# Patient Record
Sex: Female | Born: 1960 | Race: Asian | Smoking: Never smoker
Health system: Southern US, Community
[De-identification: ages and names within clinical notes are randomized; demographics above are authoritative.]

## PROBLEM LIST (undated history)

## (undated) DIAGNOSIS — D649 Anemia, unspecified: Secondary | ICD-10-CM

## (undated) DIAGNOSIS — F329 Major depressive disorder, single episode, unspecified: Secondary | ICD-10-CM

## (undated) DIAGNOSIS — B1920 Unspecified viral hepatitis C without hepatic coma: Secondary | ICD-10-CM

## (undated) DIAGNOSIS — K769 Liver disease, unspecified: Secondary | ICD-10-CM

## (undated) DIAGNOSIS — F32A Depression, unspecified: Secondary | ICD-10-CM

## (undated) DIAGNOSIS — K219 Gastro-esophageal reflux disease without esophagitis: Secondary | ICD-10-CM

## (undated) DIAGNOSIS — F419 Anxiety disorder, unspecified: Secondary | ICD-10-CM

## (undated) HISTORY — DX: Depression, unspecified: F32.A

## (undated) HISTORY — DX: Unspecified viral hepatitis C without hepatic coma: B19.20

## (undated) HISTORY — DX: Liver disease, unspecified: K76.9

## (undated) HISTORY — DX: Major depressive disorder, single episode, unspecified: F32.9

## (undated) HISTORY — DX: Anxiety disorder, unspecified: F41.9

## (undated) HISTORY — PX: OTHER SURGICAL HISTORY: SHX169

## (undated) HISTORY — DX: Gastro-esophageal reflux disease without esophagitis: K21.9

## (undated) HISTORY — DX: Anemia, unspecified: D64.9

---

## 2011-04-21 ENCOUNTER — Ambulatory Visit (INDEPENDENT_AMBULATORY_CARE_PROVIDER_SITE_OTHER): Payer: Self-pay | Admitting: Gastroenterology

## 2011-04-21 VITALS — BP 127/74 | HR 90 | Temp 98.3°F | Ht 61.0 in | Wt 169.0 lb

## 2011-04-21 DIAGNOSIS — B182 Chronic viral hepatitis C: Secondary | ICD-10-CM

## 2011-04-28 NOTE — Progress Notes (Signed)
NAME:  [name], [name]    MR#:  [id number]      DATE:  04/21/2011  DOB:  [birth year]    cc: Referring Physician:  Bethanie Dicker, MD, [facility], [address] [place], [place] [address], QMV [phone]    REASON FOR REFERRAL:  Positive HCV RNA, genotype 3a.    History:  The patient is a 50 year old woman who I have been asked to see in consultation by Dr. Eulis Foster regarding her positive HCV RNA.  According to the niece, who accompanied her today and provided translation, the patient was known to be hepatitis C positive while living in [place] 6-7 years ago. She is unaware of her genotype and  this was not measured as part of lab testing that was sent. She has never been biopsied nor has she been treated. She has occasional nausea with fatigue and pruritus, but has no symptoms directly  referable to her history of hepatitis C, as fatigue and nausea not related and on her liver tests, she has no jaundice as recently as 03/16/2011. There are no symptoms to suggest cryoglobulin mediated or  decompensated liver disease.  With respect to risk factors for liver disease, she denies any significant alcohol use over the course of lifetime. There is no history of intravenous or intranasal drug use. There may be unsterile  body piercing. There are no tattoos. She received a blood transfusion in [place] 13 years ago.   FAMILY HISTORY:  Significant for both parents and 2 brothers who had hepatitis C. She has not been vaccinated against hepatitis A or B.   PAST MEDICAL HISTORY:  Significant for insulin resistance. Her a.m. fasting blood sugar that she measures approximately once a week is usually 120. She is not on any medications for this. In addition, I see hemoglobin A1c of 6.3% on  03/16/2011. There is a history of normocytic but hypochromic anemia with a CBC on 03/16/2011, of 9.3. MCV was 83 and MCH was 24.3.  Old labs she brought in from 03/11/2009, in [place], her  hemoglobin was 10.91 with an MCV of 78.4 and MCH of 124.88. She has never been investigated for the cause of anemia that she is aware of. The only interventions she has had is being told to take iron, which she is on once daily by mouth. Otherwise, she has elevated triglycerides in her lab testing but she is unaware of this. There is no history of coronary disease.   PAST SURGICAL HISTORY:  Denies.   Past psychiatric history:  Denies.   MEDICATIONS:  Nexium dose unknown daily, iron pills 1 once daily, calcium once daily dose unknown.   Allergies:  None.    Habits:  Smoking, denies. Alcohol as above.   FAMILY HISTORY:  As above.   SOCIAL HISTORY:  Widowed, has 4 children. She lives in [place]. She is accompanied by her niece today who provides translation. She emigrated from [place] approximately 3 years ago.   REVIEW OF SYSTEMS:  All 10 systems reviewed today on the review of systems form, which was signed and placed in the chart. CES-D was 27.   PHYSICAL EXAMINATION:   Constitutional:  Well appearing. Vital signs: Height 61 inches, weight 169 pounds, blood pressure 127/74, pulse 90, temperature 98.3 Fahrenheit. Ears, nose, mouth and throat:  Unremarkable oropharynx.   No thyromegaly or neck masses.  Chest:  Resonant to percussion.  Clear to auscultation.  Cardiovascular:  Heart sounds normal S1, S2 without murmurs  or rubs.  There is no peripheral edema.  Abdominal:  Normal  bowel sounds.  No masses or tenderness.  I could not appreciate a liver edge or spleen tip.  I could not appreciate any hernias.  Lymphatics:  No cervical or inguinal lymphadenopathy.  Central Nervous  System:  No asterixis or focal neurologic findings.  Dermatologic:  Anicteric without palmar erythema or spider angiomata.  Eyes:  Anicteric sclerae.  Pupils are equal and reactive to light.   LABORATORY STUDIES:  From 03/16/2011; AST 30, ALT 29, albumin 3.9, globulins 2.9. Triglycerides 168. Hemoglobin A1c was  6.3%. CBC, white count 8.2, hemoglobin 9.3, MCV 83, MCH 24.3, MCHC 29.3, platelet count 536. Her  ANC was 4.9. Her ALP was 74, total bilirubin 0.2, creatinine 0.72.  On 04/16/2010, her hepatitis C antibody was positive. Her hepatitis B surface antigen negative.   On 05/24/2010, her HCV RNA was 22,000 international units per mL, genotype was 3a and her hemoglobin A1c was 7.2%.   On 04/16/2010, her hemoglobin was 9.4 with MCV of 74.3.   On 07/28/2010, it was 9.2 with MCV 77, MCH of 21.8, both of which were low.   On 09/23/2010, the iron saturation could not be performed as there was an insufficient sample. B12 and folic acid levels were normal, ferritin was 23.  From [place] a CT scan report with contrast on 10/31/2010, showed the liver was hypoattenuated consistent with fat, but there is no mention of changes of cirrhosis or portal hypertension.   ASSESSMENT:  The patient is a 50 year old woman with history genotype 3a hepatitis C who appears to be clinically and biochemically well compensated. I have 2 concerns about treating her for her hepatitis C. First is that  of her normocytic but hypochromic anemia for which she has been on iron with some possible response over time. More recently, her hemoglobin does not look like it is responding and it is too low to be considered for treatment of her hepatitis C. She may need endoscopy to further evaluate the cause of her anemia, if it indeed remains microcytic and hypochromic with iron deficiency, The second issue is that of her insulin resistance. There is a potential that this will then develop into diabetes while on treatment and the affect of interferon. As an aside, I noticed she has hypertriglyceridemia which could be exacerbated with interferon, as well.  In my discussion today with the patient and her niece, we discussed the nature and natural history of hepatitis C including genotyping. We discussed the treatment with pegylated interferon  and ribavirin. I  discussed our treatment protocol for our clinic, as well as response rates. I reviewed the specific systems, psychiatric, and constitutional side effects of therapy, as well. My discussion then  turned on the issues of her anemia and her insulin resistance, and how these would impact on her candidacy for treatment. We discussed the possibility of needing to do endoscopies at [facility] if she has iron  deficiency and also possibly starting her on metformin for her blood sugars prior to commencing on therapy for hepatitis C.   plan:  1. Standard labs today. 2. Test for hepatitis A and B immunity. 3. Given literature on hepatitis C. 4. Based on her CBC, we will see about the need for endoscopy. 5. She is to return in approximately 6-8 week's time to review the results of testing and to discuss the possibility of treatment if labs are acceptable.  Brooke Dare, MD    ADDENDUM:  There is no record of the labs that I ordered being done.  I called [facility] and they faxed the only labs they had, which were from 04/17/11  his showed a HgB of 9.4 with microcytosis and hypochromasia.  Her alb was 3.9 and total bil was 0.3, with a creatinine of 0.69. Hep B s Ag neg.Marland Kitchen  TSH 3.798.  She cannot be treated without labs, and her microcytic anemia needs to be addressed at the [facility] [place] before she can be treated.  I do not have endoscopy privileges in [place].   403 .[id number]  D:  Thu Aug 16 14:50:29 2012 ; T:  Thu Aug 16 16:42:14 2012  Job #:  [id number]

## 2011-05-11 ENCOUNTER — Other Ambulatory Visit: Payer: Self-pay | Admitting: Gastroenterology

## 2011-06-16 ENCOUNTER — Other Ambulatory Visit: Payer: Self-pay | Admitting: Gastroenterology

## 2011-06-16 ENCOUNTER — Ambulatory Visit (INDEPENDENT_AMBULATORY_CARE_PROVIDER_SITE_OTHER): Payer: Self-pay | Admitting: Gastroenterology

## 2011-06-16 DIAGNOSIS — B182 Chronic viral hepatitis C: Secondary | ICD-10-CM

## 2011-06-17 LAB — CBC WITH DIFFERENTIAL/PLATELET
Eosinophils Absolute: 0.2 10*3/uL (ref 0.0–0.7)
Hemoglobin: 8.7 g/dL — ABNORMAL LOW (ref 12.0–15.0)
Lymphocytes Relative: 32 % (ref 12–46)
Lymphs Abs: 3.2 10*3/uL (ref 0.7–4.0)
MCH: 20 pg — ABNORMAL LOW (ref 26.0–34.0)
MCV: 70.2 fL — ABNORMAL LOW (ref 78.0–100.0)
Monocytes Relative: 7 % (ref 3–12)
Neutrophils Relative %: 59 % (ref 43–77)
RBC: 4.36 MIL/uL (ref 3.87–5.11)
WBC: 10.1 10*3/uL (ref 4.0–10.5)

## 2011-06-24 ENCOUNTER — Encounter: Payer: Self-pay | Admitting: Gastroenterology

## 2011-06-24 NOTE — Progress Notes (Signed)
[name], [name]    MR#:  [id number]      DATE:  06/16/2011  DOB:  1961-07-01    cc: Referring Physician:  Bethanie Dicker, MD, [facility], [address] [place], [place] [address], [place] [id number]    REASON FOR VISIT:  Followup genotype 3A hepatitis C.   History:  The patient returns today accompanied by her daughter who provided translation. Since last being seen on 04/21/2011, there are no symptoms referable to history of hepatitis C. It will be recalled that  when I saw her she had a normocytic hypochromic anemia for which she was given iron. It may have been on the basis of menorrhagia. At the time I saw her, for some reason labs that I ordered were not  processed, but she returned on 05/11/2011, for labs which showed a hemoglobin of 9.1, MCV 76.1 and MCH of 20.7 indicating a microcytic hypochromic anemia. Iron saturation was not calculated, considering  her iron was low, because her TIBC was too high. Her ferritin was 6.4 at the time.  Folic acid and B12 levels were normal.  The patient's daughter reports that the menorrhagia started to improve the end of July, and stopped by August. She remained on iron supplement until 2 weeks ago when she ran out. When asked when they  called [facility] of [address], according the daughter, they were told not to resume this until she was seen and lab work was done.   Past medical history:  There has been no other interval change.   CURRENT MEDICATION:  Nexium dose unknown daily.  As mentioned above, she ran out of the iron and calcium she was taking 2 weeks ago, and has not resumed them pending an appointment in [facility].   ALLERGIES:  None.   HABITS:  Smoking, denies. Alcohol, denies interval consumption.   REVIEW OF SYSTEMS:  All 10 systems reviewed today with the patient via her daughter as Nurse, learning disability and they are negative other than which is mentioned above. CES-D was 27.   PHYSICAL EXAMINATION:     Constitutional:  Appeared stated age without significant peripheral wasting. Vital signs: Height 61 inches, weight 171 pounds, blood pressure 133/81, pulse 94, temperature 98 Fahrenheit.   LABORATORY STUDIES:  From 05/11/2011, CBC and iron saturation as mentioned above with a ferritin of 4. Total bilirubin 0.2, AST was 27, ALT 24, ALP 76. INR was 1.02.  Hemoglobin A1c was 6.8%. Her hepatitis B surface antigen  was negative. The hepatitis B surface antibody and total A antibody were positive. Her genotype was 3a. Her ANA was negative, and her HIV was negative.   ASSESSMENT:  The patient is a 50 year old woman with a history of genotype 3a hepatitis C who is biochemically well compensated. 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 We will have to check a CBC today to see if this is resolved because the daughter attributes this to  menorrhagia, which stopped in July 2012. Of concern is the fact that she stopped her iron supplement. There is indication that she may have insulin resistance if not perhaps untreated type 2 diabetes with her  blood sugars and hemoglobin A1c as they are, but at this point, I think it will probably just need to be watched as it has been enough of an effort to try and have her  anemia resolved.  In my discussion today with the patient's daughter, I discussed the results of the lab work from 05/11/2011. I have explained to her I need to do another CBC to see if her hemoglobin is improving. I have  suggested that she start back on ferrous sulfate. If not that because of GI upset of ferrous gluconate or ferrous fumarate over the counter and not wait for an appointment in [facility]. I also explained that  she should resume the calcium, but this is not within the purview of why I have been asked to see her. We then discussed application  for  patient assistance since she lacks insurance. The family filled out the application forms while they were in the office.   plan:  1. Hepatitis A and B immune. 2. CBC and iron saturation today. 3. If the CBC is showing improvement with resolution of the anemia, then will complete my section of the application for patient assistance with [employer]. If approved, then medications will be shipped to the patient and the patient and family will be brought in for a teaching visit.            Brooke Dare, MD   ADDENDUM:  HgB 8.7, hypochromic, microcytic, so cannot start on therapy.  Iron saturation 4% I have written to [facility] to follow up on the HgB and offering endoscopy at [facility] to evaluate the anemia in addition to making sure she on the iron.  I would ask for the assistance of her primary doctor to assist in the resolution of the anemia.  403 .[id number]  D:  Thu Oct 11 18:33:01 2012 ; T:  Thu Oct 11 20:07:56 2012  Job #:  [id number]

## 2011-09-02 ENCOUNTER — Telehealth: Payer: Self-pay

## 2011-09-07 ENCOUNTER — Ambulatory Visit (INDEPENDENT_AMBULATORY_CARE_PROVIDER_SITE_OTHER): Payer: Self-pay | Admitting: Internal Medicine

## 2011-09-07 ENCOUNTER — Encounter: Payer: Self-pay | Admitting: Internal Medicine

## 2011-09-07 VITALS — BP 120/84 | HR 70 | Ht 63.0 in | Wt 168.0 lb

## 2011-09-07 DIAGNOSIS — K219 Gastro-esophageal reflux disease without esophagitis: Secondary | ICD-10-CM

## 2011-09-07 DIAGNOSIS — D509 Iron deficiency anemia, unspecified: Secondary | ICD-10-CM

## 2011-09-07 DIAGNOSIS — B182 Chronic viral hepatitis C: Secondary | ICD-10-CM

## 2011-09-07 DIAGNOSIS — Z1211 Encounter for screening for malignant neoplasm of colon: Secondary | ICD-10-CM

## 2011-09-07 MED ORDER — PEG-KCL-NACL-NASULF-NA ASC-C 100 G PO SOLR: 1.0000 | Freq: Once | ORAL | Status: DC

## 2011-09-07 NOTE — Patient Instructions (Signed)
You have been scheduled for a Upper Endoscopy/ Colonoscopy. See separate instructions.  You have been given a prep kit for your procedure.  cc: Brooke Dare, MD

## 2011-09-07 NOTE — Progress Notes (Signed)
HISTORY OF PRESENT ILLNESS:  [name] is a 51 y.o. female non-English-speaking [place] who is sent today by, I believe, Dr. Brooke Dare of the hepatology medical specialties clinic. She is seen in that clinic regarding chronic hepatitis C. She has not received treatment, in part due to anemia.Patient is a current resident of [place] and receives her general medical care through the community clinic of [facility].she is sent here today regarding iron deficiency anemia. He view of outside records shows a microcytic anemia with a hemoglobin of 9.1, MCV 76.1, and ferritin level of 6.4. Normal folic acid and B12 levels. She had been having regular menstrual periods. She is accompanied by her daughter who speaks excellent [place] and serves as a Nurse, learning disability. They were not sure why she was here today or who initiated the consultation. I explained the purpose of the visit as gleaned through outside notes. GI review of systems is remarkable for occasional heartburn, bloating, and vague generalized abdominal discomfort. No melena or hematochezia. She continues to menstruate. She is currently on iron. Nexium helps reflux symptoms. No dysphagia. No prior history of GI problems or GI evaluations. Her husband died from cirrhosis of the liver and has had prior colonoscopy and upper endoscopy per the daughter's report. Her weight has been stable.  REVIEW OF SYSTEMS:  All non-GI ROS negative except for anxiety, back pain, confusion, depression, fatigue, headaches, hearing problems, itching, muscle cramps, skin rash, shortness of breath, sleeping problems  Past Medical History  Diagnosis Date  . Anemia   . Hepatitis C   . Chronic liver disease   . Anxiety   . Depression   . Diabetes mellitus     Past Surgical History  Procedure Date  . None     Social History Shalissa Easterwood  reports that she has never smoked. She has never used smokeless tobacco. She reports that she does not drink  alcohol or use illicit drugs.  family history includes Liver disease in an unspecified family member.  No Known Allergies     PHYSICAL EXAMINATION: Vital signs: BP 120/84  Pulse 70  Ht 5\' 3"  (1.6 m)  Wt 168 lb (76.204 kg)  BMI 29.76 kg/m2  LMP 09/02/2011  Constitutional: generally well-appearing, no acute distress Psychiatric: alert and oriented x3, cooperative Eyes: extraocular movements intact, anicteric, conjunctiva pink Mouth: oral pharynx moist, no lesions Neck: supple no lymphadenopathy Cardiovascular: heart regular rate and rhythm, no murmur Lungs: clear to auscultation bilaterally Abdomen: soft, nontender, nondistended, no obvious ascites, no peritoneal signs, normal bowel sounds, no organomegaly Rectal:deferred until colonoscopy Extremities: no lower extremity edema bilaterally Skin: no lesions on visible extremities Neuro: No focal deficits. No asterixis.    ASSESSMENT:  #1. Iron deficiency anemia. Possibly secondary to chronic menstruation. Rule out GI mucosal pathology #2. GERD. Symptoms improved on Nexium #3. Colon cancer screening. Has not had previously. #4. Hepatitis C. Followed in the medical specialties clinic #5. General medical problems. Followed in the community clinic in [facility]   PLAN:  #1. Schedule colonoscopy and upper endoscopy to evaluate iron deficiency anemia and provide colon cancer screening. The nature of the procedure as well as the risks, benefits, and alternatives were reviewed. Through interpretation, she understood and agreed to proceed. Movi prep provided. She was instructed on its use. We will try to get her into soon, as her daughter, who serves as her care partner, resumes College next week. #2. Reflux precautions #3. Continue Nexium #4. Continue to followup with Dr. Joetta Manners regarding  hepatitis C #5. Continue continuity care with the [facility] in [facility] ( Dr. Bethanie Dicker)

## 2011-09-09 ENCOUNTER — Encounter: Payer: Self-pay | Admitting: Internal Medicine

## 2011-09-09 ENCOUNTER — Ambulatory Visit (AMBULATORY_SURGERY_CENTER): Payer: Self-pay | Admitting: Internal Medicine

## 2011-09-09 VITALS — BP 136/85 | HR 89 | Temp 99.6°F | Resp 20 | Ht 63.0 in | Wt 168.0 lb

## 2011-09-09 DIAGNOSIS — D649 Anemia, unspecified: Secondary | ICD-10-CM

## 2011-09-09 DIAGNOSIS — D509 Iron deficiency anemia, unspecified: Secondary | ICD-10-CM

## 2011-09-09 MED ORDER — SODIUM CHLORIDE 0.9 % IV SOLN: 500.0000 mL | INTRAVENOUS | Status: AC

## 2011-09-09 NOTE — Op Note (Signed)
[facility] [address] [place], [place]  [address]  ENDOSCOPY PROCEDURE REPORT  PATIENT:  [name], [name]  MR#:  [id number] BIRTHDATE:  1961-08-07, 50 yrs. old  GENDER:  female  ENDOSCOPIST:  Wilhemina Bonito. Eda Keys, MD Referred by:  Brooke Dare, M.D.  PROCEDURE DATE:  09/09/2011 PROCEDURE:  EGD with biopsy, 432[address] ASA CLASS:  Class II INDICATIONS:  iron deficiency anemia  MEDICATIONS:   There was residual sedation effect present from prior procedure. TOPICAL ANESTHETIC:  Cetacaine Spray  DESCRIPTION OF PROCEDURE:   After the risks benefits and alternatives of the procedure were thoroughly explained, informed consent was obtained.  The LB [id number] endoscope was introduced through the mouth and advanced to the third portion of the duodenum, without limitations.  The instrument was slowly withdrawn as the mucosa was fully examined. <<PROCEDUREIMAGES>>  The upper, middle, and distal third of the esophagus were carefully inspected and no abnormalities were noted. The z-line was well seen at the GEJ. The endoscope was pushed into the fundus which was normal including a retroflexed view. The antrum,gastric body, first, second, and third part of the duodenum were unremarkable. Post-bulbar duodenal bx taken.   Retroflexed views revealed no abnormalities.    The scope was then withdrawn from the patient and the procedure completed.  COMPLICATIONS:  None  ENDOSCOPIC IMPRESSION: 1) Normal EGD 2) No obvious GI cause for anemia (? chronic menstral losses)  RECOMMENDATIONS: 1) Await pathology results r/o sprue 2) Take iron supplement twice daily 3) RETURN TO CARE OF [facility]. THEY CAN CHECK A FOLLOW UP BLOOD COUNT FOR YOU.  ______________________________ Wilhemina Bonito. Eda Keys, MD  CC:  Brooke Dare, MD; The Patient; Dr. Bethanie Dicker [facility])  n. eSIGNED:   Wilhemina Bonito. Eda Keys at 09/09/2011 10:45 AM  Joaquim Nam, [phone]

## 2011-09-09 NOTE — Op Note (Signed)
[facility] [address] [place], [place]  [address]  COLONOSCOPY PROCEDURE REPORT  PATIENT:  [name], Maria  MR#:  [id number] BIRTHDATE:  1961-07-24, 50 yrs. old  GENDER:  female ENDOSCOPIST:  Wilhemina Bonito. Eda Keys, MD REF. BY:  Brooke Dare, M.D. PROCEDURE DATE:  09/09/2011 PROCEDURE:  Diagnostic Colonoscopy ASA CLASS:  Class II INDICATIONS:  Iron Deficiency Anemia MEDICATIONS:   Fentanyl 100 mcg IV, Versed 10 mg IV, These medications were titrated to patient response per physician's verbal order  DESCRIPTION OF PROCEDURE:   After the risks benefits and alternatives of the procedure were thoroughly explained, informed consent was obtained.  Digital rectal exam was performed and revealed no abnormalities.   The LB 180AL [id number] endoscope was introduced through the anus and advanced to the cecum, which was identified by both the appendix and ileocecal valve, without limitations.  The quality of the prep was excellent, using MoviPrep.  The instrument was then slowly withdrawn as the colon was fully examined. <<PROCEDUREIMAGES>>  FINDINGS:  One tiny nonbleeding arteriovenous malformations were seen in the in the cecum.  Otherwise normal colonoscopy without polyps, masses,  or inflammatory changes.  The terminal ileum appeared normal.   Retroflexed views in the rectum revealed no abnormalities.    The time to cecum = 2:56  minutes. The scope was then withdrawn in  7:53  minutes from the cecum and the procedure completed.  COMPLICATIONS:  None  ENDOSCOPIC IMPRESSION: 1) Tiny AVM in the cecum 2) Otherwise normal colonoscopy 3) Normal terminal ileum  RECOMMENDATIONS: 1) Continue current colorectal screening recommendations for "routine risk" patients with a repeat colonoscopy in 10 years. 2) Upper endoscopy today (SEE REPORT AND RECOMMEDATIONS)  ______________________________ Wilhemina Bonito. Eda Keys, MD  CC:  Brooke Dare, MD; The Patient; Dr Bethanie Dicker  [facility])  n. eSIGNED:   Wilhemina Bonito. Eda Keys at 09/09/2011 10:39 AM  Joaquim Nam, [phone]

## 2011-09-09 NOTE — Patient Instructions (Signed)
Green and blue discharge instructions reviewed with patient and care partner.  Impressions:  Normal exams.  Resume medications as you have been taking them prior to your procedure.  Repeat colonoscopy in 10 years.

## 2011-09-09 NOTE — Progress Notes (Signed)
Patient did not experience any of the following events: a burn prior to discharge; a fall within the facility; wrong site/side/patient/procedure/implant event; or a hospital transfer or hospital admission upon discharge from the facility. (G8907) Patient did not have preoperative order for IV antibiotic SSI prophylaxis. (G8918)  

## 2011-09-12 ENCOUNTER — Telehealth: Payer: Self-pay | Admitting: *Deleted

## 2011-09-12 NOTE — Telephone Encounter (Signed)
Follow up Call- Patient questions:  Do you have a fever, pain , or abdominal swelling? no Pain Score  0 *  Have you tolerated food without any problems? yes  Have you been able to return to your normal activities? yes  Do you have any questions about your discharge instructions: Diet   no Medications  no Follow up visit  no  Do you have questions or concerns about your Care? no  Actions: * If pain score is 4 or above: No action needed, pain <4.  Spoke with pts daughter Virl Son.

## 2011-09-14 ENCOUNTER — Encounter: Payer: Self-pay | Admitting: Internal Medicine

## 2011-10-13 ENCOUNTER — Other Ambulatory Visit: Payer: Self-pay | Admitting: Gastroenterology

## 2011-10-13 ENCOUNTER — Telehealth: Payer: Self-pay | Admitting: Gastroenterology

## 2011-10-13 DIAGNOSIS — B182 Chronic viral hepatitis C: Secondary | ICD-10-CM

## 2011-10-13 NOTE — Telephone Encounter (Signed)
I have reviewed the endoscopy reports and talked to the patient's daughter.  I need to see if her HgB has improved because she cannot be treated unless the HgB is normal or near normal.   The daughter will take [name] to [facility] for repeat labs.  I will order them and mail the req's to her.

## 2011-10-14 ENCOUNTER — Telehealth: Payer: Self-pay

## 2011-10-26 ENCOUNTER — Telehealth: Payer: Self-pay | Admitting: *Deleted

## 2011-10-26 NOTE — Telephone Encounter (Signed)
Called pt and informed her daughter that the appt is with a female provider. She voiced understanding and had no questions.

## 2011-10-26 NOTE — Telephone Encounter (Signed)
Pt called stating wants to know if her appt on March 8th is with a female provider because only wants to see female.

## 2011-11-03 ENCOUNTER — Telehealth: Payer: Self-pay | Admitting: Gastroenterology

## 2011-11-03 NOTE — Telephone Encounter (Signed)
GCCN card had expired.  Will have a renewed card tomorrow and will go to get her CBC checked as ordered on 10/13/11.  Once resulted and if the CBC is improved, I can send off the application for [facility] Access to Care.   Discussed with daughter who understood.

## 2011-11-07 ENCOUNTER — Encounter: Payer: Self-pay | Admitting: *Deleted

## 2011-11-08 ENCOUNTER — Telehealth: Payer: Self-pay

## 2011-11-11 ENCOUNTER — Ambulatory Visit (INDEPENDENT_AMBULATORY_CARE_PROVIDER_SITE_OTHER): Payer: Self-pay | Admitting: Obstetrics and Gynecology

## 2011-11-11 ENCOUNTER — Encounter: Payer: Self-pay | Admitting: Obstetrics and Gynecology

## 2011-11-11 DIAGNOSIS — N83209 Unspecified ovarian cyst, unspecified side: Secondary | ICD-10-CM

## 2011-11-11 DIAGNOSIS — N83202 Unspecified ovarian cyst, left side: Secondary | ICD-10-CM | POA: Insufficient documentation

## 2011-11-11 NOTE — Progress Notes (Signed)
  Subjective:    Patient ID: [name], female    DOB: [birth year], 51 y.o.   MRN: [id number]  HPI 51 yo [id number] with LMP 10/31/2011 and BMI 32 presenting today as a referral from [facility] clinic in [place]. Patient is not sure why the referral was made for her. Upon review of the records patient had an incidental findings of a left 5.8cm left adnexal lesion during the work-up for kidney stone in November 2012. Patient has been without complaints and denies any loss in appetite/weight or increase in abdominal girth. Patient reports normal monthly menses lasting 4-5 days. They are at times heavy with passage of clots. No intermenstrual bleeding. Patient denies pelvic pain or pain with her menses. Patient also reports h/o kidney stones. Patient otherwise without complaints.  Past Medical History  Diagnosis Date  . Anemia   . Hepatitis C   . Chronic liver disease   . Anxiety   . Depression   . Diabetes mellitus   . GERD (gastroesophageal reflux disease)    Past Surgical History  Procedure Date  . None    Family History  Problem Relation Age of Onset  . Liver disease    . Colon cancer Neg Hx   . Liver disease Mother   . Liver disease Father    History   Social History  . Marital Status: Widowed    Spouse Name: N/A    Number of Children: N/A  . Years of Education: N/A   Occupational History  . unemployed    Social History Main Topics  . Smoking status: Never Smoker   . Smokeless tobacco: Never Used  . Alcohol Use: No  . Drug Use: No  . Sexually Active: No   Other Topics Concern  . Not on file   Social History Narrative  . No narrative on file    Review of Systems  All other systems reviewed and are negative.      Objective:   Physical Exam GENERAL: Well-developed, well-nourished female in no acute distress.  ABDOMEN: Soft, nontender, nondistended. No organomegaly. PELVIC: Normal external female genitalia. Vagina is pink and rugated.  Normal discharge.  Normal appearing cervix. Uterus is normal in size.  No adnexal mass or tenderness. EXTREMITIES: No cyanosis, clubbing, or edema, 2+ distal pulses.   07/2011 CT abdomen and pelvis impression:  1)  Two punctate non-obstructing stones are identifies in right kidney. No hydronephrosis or ureteral stones on the right or left 2)  5.8 cm low attenuating left adnexal lesion. Unremarkable uterus. Right ovary not visualized    Assessment & Plan:  51 yo [id number] here as a referral for evaluation of left adnexal lesion seen on ultrasound - patient scheduled to have annual exam in [facility] - patient scheduled for mammography on march 12 - Will order pelvic ultrasound for follow-up on adnexal lesion seen in November - Patient will return in 2-3 weeks to discuss the results of the ultrasound and further management if indicated

## 2011-11-14 ENCOUNTER — Other Ambulatory Visit: Payer: Self-pay | Admitting: Obstetrics and Gynecology

## 2011-11-14 DIAGNOSIS — Z1231 Encounter for screening mammogram for malignant neoplasm of breast: Secondary | ICD-10-CM

## 2011-11-15 ENCOUNTER — Ambulatory Visit (INDEPENDENT_AMBULATORY_CARE_PROVIDER_SITE_OTHER): Payer: Self-pay | Admitting: *Deleted

## 2011-11-15 ENCOUNTER — Ambulatory Visit (HOSPITAL_COMMUNITY)
Admission: RE | Admit: 2011-11-15 | Discharge: 2011-11-15 | Disposition: A | Discharge status: Home / Self Care | Payer: Self-pay | Source: Ambulatory Visit | Attending: Obstetrics and Gynecology | Admitting: Obstetrics and Gynecology

## 2011-11-15 VITALS — BP 133/78 | HR 101 | Temp 97.0°F | Ht 61.25 in | Wt 166.9 lb

## 2011-11-15 DIAGNOSIS — Z1231 Encounter for screening mammogram for malignant neoplasm of breast: Secondary | ICD-10-CM

## 2011-11-15 DIAGNOSIS — Z01419 Encounter for gynecological examination (general) (routine) without abnormal findings: Secondary | ICD-10-CM

## 2011-11-15 NOTE — Progress Notes (Signed)
Complaints of occasional breast tenderness.  Pap Smear:    Pap smear completed today. Per patient today is patients first Pap smear. No Pap smear results in EPIC.  Physical exam: Breasts Breasts symmetrical. No skin abnormalities bilateral breasts. No nipple retraction bilateral breasts. No nipple discharge bilateral breasts. No lymphadenopathy. No lumps palpated bilateral breasts. Patient states she has some occasional breast tenderness. No complaints of pain or tenderness on exam.          Pelvic/Bimanual   Ext Genitalia No lesions, no swelling and small amount of cervical mucous observed on external genitalia.        Vagina Observed small cystocele at vaginal opening. Vagina pink and normal texture. No lesions and small amount of cervical mucous observed in vagina.          Cervix Cervix is present. Cervix pink and of normal texture. No discharge observed at cervical os.     Uterus Uterus is present and palpable. Uterus in normal position and normal size.        Adnexae Bilateral ovaries present and palpable. No tenderness on palpation.          Rectovaginal No rectal exam completed today since patient had no rectal complaints. No skin abnormalities observed on exam.

## 2011-11-15 NOTE — Patient Instructions (Signed)
Taught patient how to perform BSE and gave educational materials to take home.Today was patients first Pap smear. Recommended to patient that if this Pap smear is normal to have next Pap smear in 2 years. Patient escorted to mammography for a screening mammogram.  Let patient know will follow up with her within the next couple weeks with results by letter or phone. Patient verbalized understanding.

## 2011-11-16 ENCOUNTER — Ambulatory Visit (HOSPITAL_COMMUNITY)
Admission: RE | Admit: 2011-11-16 | Discharge: 2011-11-16 | Disposition: A | Discharge status: Home / Self Care | Payer: Self-pay | Source: Ambulatory Visit | Attending: Obstetrics and Gynecology | Admitting: Obstetrics and Gynecology

## 2011-11-16 DIAGNOSIS — N949 Unspecified condition associated with female genital organs and menstrual cycle: Secondary | ICD-10-CM | POA: Insufficient documentation

## 2011-11-16 DIAGNOSIS — N83209 Unspecified ovarian cyst, unspecified side: Secondary | ICD-10-CM | POA: Insufficient documentation

## 2011-12-02 ENCOUNTER — Encounter: Payer: Self-pay | Admitting: Obstetrics and Gynecology

## 2011-12-19 ENCOUNTER — Telehealth: Payer: Self-pay

## 2011-12-22 ENCOUNTER — Telehealth: Payer: Self-pay | Admitting: Gastroenterology

## 2011-12-22 NOTE — Telephone Encounter (Signed)
[name] called to ask about a CBC drawn at [facility] on 12/20/11.  If the CBC showed improvement, [name] asked if her aunt could start HCV treatment.  I told [name] that I have not received any CBC results.  She will get the results tomorrow, scan them, and email them to [name] at [facility] to send to me.  I told her that if the CBC shows that her aunt's anemia has resolved, then I will send the [facility] Access to Care application.  Thereafter, it could take a month to decide on her application, but if approved Mendel Ryder will call her and the [facility] will need to arrange a clinic appt for teaching.

## 2011-12-23 ENCOUNTER — Telehealth: Payer: Self-pay

## 2011-12-29 ENCOUNTER — Telehealth: Payer: Self-pay | Admitting: Gastroenterology

## 2011-12-29 NOTE — Telephone Encounter (Signed)
After receipt of most recent HgB of 11.8 on 12/09/11, I faxed an application for patient assistance to [facility] Access to Care

## 2012-01-05 ENCOUNTER — Telehealth: Payer: Self-pay | Admitting: Gastroenterology

## 2012-01-05 NOTE — Telephone Encounter (Signed)
Ms Pizano's application was received by [employer] (Cas ID [id number]  Patient ID [id number]).  The service asked me to make sure the patient calls to finalize the financial information and arrange the delivery of medication.  I called Salma and left a message asking her to call [employer], and I gave her the phone number and her aunt's case ID.

## 2012-01-26 ENCOUNTER — Ambulatory Visit (INDEPENDENT_AMBULATORY_CARE_PROVIDER_SITE_OTHER): Payer: Self-pay | Admitting: Gastroenterology

## 2012-01-26 DIAGNOSIS — B182 Chronic viral hepatitis C: Secondary | ICD-10-CM

## 2012-01-26 NOTE — Patient Instructions (Signed)
For any questions to speak with the doctor [phone]

## 2012-01-27 LAB — CBC WITH DIFFERENTIAL/PLATELET
Basophils Absolute: 0 10*3/uL (ref 0.0–0.1)
Eosinophils Absolute: 0.2 10*3/uL (ref 0.0–0.7)
Eosinophils Relative: 2 % (ref 0–5)
Lymphocytes Relative: 28 % (ref 12–46)
Lymphs Abs: 2.6 10*3/uL (ref 0.7–4.0)
MCH: 25.3 pg — ABNORMAL LOW (ref 26.0–34.0)
MCV: 77.5 fL — ABNORMAL LOW (ref 78.0–100.0)
Neutrophils Relative %: 64 % (ref 43–77)
Platelets: 378 10*3/uL (ref 150–400)
RBC: 4.54 MIL/uL (ref 3.87–5.11)
RDW: 14.8 % (ref 11.5–15.5)
WBC: 9.3 10*3/uL (ref 4.0–10.5)

## 2012-01-27 LAB — HEPATIC FUNCTION PANEL
ALT: 32 U/L (ref 0–35)
AST: 33 U/L (ref 0–37)
Total Protein: 7.1 g/dL (ref 6.0–8.3)

## 2012-01-30 LAB — HEPATITIS C RNA QUANTITATIVE: HCV Quantitative: 27328 IU/mL — ABNORMAL HIGH (ref <43)

## 2012-02-02 NOTE — Progress Notes (Signed)
NAMEJAYDYN, MENON    MR#:  [id number]      DATE:  01/26/2012  DOB:  16-Jul-1961    cc: Consulting Physician:  Catalina Antigua, MD, OB/GYN   Wilhemina Bonito. Marina Goodell, MD, Gastroenterology  Primary Care Physician: Same  Referring Physician: Bethanie Dicker, MD, [facility], [address] [place], [place] [address], [facility] [phone]    reason for visit:  Follow up of genotype 3a HCV initiation of therapy.   HISTORY:  The patient returns today accompanied by her daughter who provides translation. I last saw them on 06/16/2011, for her genotype 3a hepatitis C. There was delay in initiation of therapy because of the discovery of a normocytic hypochromic anemia with evidence of iron-deficiency. The patient had undergone endoscopic evaluation by Dr. Marina Goodell, EGD on 09/09/2011, that was negative and a colonoscopy that showed a tiny AVM in the cecum, but otherwise was unremarkable including the terminal ileum. She was then seen by Dr. Jolayne Panther on 11/11/2011, because of the finding of a left adnexal lesion on ultrasound, it appears that this was an ovarian cyst. Today, the daughter reports that the iron-deficiency anemia has been attributed to heavy perimenopausal menses. They have been anxious. The patient and her daughter have been anxious to start on therapy. Indeed a CBC from 12/08/2011, demonstrating hemoglobin 11.8, up from 11.2 previously and as low as 9. Her iron saturation at that time was 9%. The patient qualified for financial assistance through [facility] Access to Care, and brings in the medications today. There has been no other new developments related to her hepatitis C.   PAST MEDICAL HISTORY:  There has been no other new developments related to her health, otherwise.   CURRENT MEDICATIONS: Multivitamins including one containing iron.    ALLERGIES:  None.   HABITS:  Smoking denies. Alcohol denies interval consumption.   REVIEW OF SYSTEMS:  All 10 systems reviewed today  with the patient and they are negative other than which is mentioned above. She did not complete the CES-D.   PHYSICAL EXAMINATION:  Constitutional: Well appearing. Vital Signs: Height 61 inches, weight 163 pounds, blood pressure 135/77, pulse of 86, temperature 96.5 Fahrenheit.   ASSESSMENT:  The patient is a 51 year old woman with history of genotype 3a hepatitis C who is biochemically well compensated. She is naive to treatment. A biopsy has not been done as it was not necessary. It appears from her lab testing that her iron-deficiency related anemia has resolved enough that she could be started on treatment. There is also an indication of insulin resistance in the past, but she was not diagnosed with diabetes.   In my discussion today with the patient via her daughter who acts as Nurse, learning disability, we discussed commencement of therapy with pegylated interferon and ribavirin. I have demonstrated how to dose the Pegasys, as well as ribavirin. We discussed the constitutional, psychiatric and specific system side effects of therapy. We discussed our treatment protocol. We discussed the need to obtain labs as ordered and the importance of doing so.   PLAN:  1. Hepatitis A and B immune.  2. CBC with differential, liver enzymes, and baseline HCV ordered today.  3. Start date will be tonight assuming that she can get her labs drawn at [facility].  4. She will return in 2 weeks' time for week 2 visit.              Brooke Dare, MD   ADDENDUM:  Baseline viral load 27328  IU/ml  4.44 log IU/mL  HgB 11.5     [id number]  D:  Thu May 23 19:00:14 2013 ; T:  Thu May 23 22:05:55 2013  Job #:  [id number]

## 2012-02-08 ENCOUNTER — Ambulatory Visit (INDEPENDENT_AMBULATORY_CARE_PROVIDER_SITE_OTHER): Payer: Self-pay | Admitting: Physician Assistant

## 2012-02-08 ENCOUNTER — Encounter: Payer: Self-pay | Admitting: Family Medicine

## 2012-02-08 VITALS — BP 122/79 | HR 113 | Temp 97.6°F | Resp 16 | Ht 60.0 in | Wt 160.9 lb

## 2012-02-08 DIAGNOSIS — N938 Other specified abnormal uterine and vaginal bleeding: Secondary | ICD-10-CM

## 2012-02-08 DIAGNOSIS — N949 Unspecified condition associated with female genital organs and menstrual cycle: Secondary | ICD-10-CM

## 2012-02-08 DIAGNOSIS — N83209 Unspecified ovarian cyst, unspecified side: Secondary | ICD-10-CM

## 2012-02-08 NOTE — Progress Notes (Signed)
Pt states she began Tx for Hepatits C last month- has received 2 injections.  Pt wants to know results of [place] in March - she did not understand that she needed follow up after pelvic US for results.  Pt has been having abnormal bleeding x2 months.

## 2012-02-08 NOTE — Patient Instructions (Signed)

## 2012-02-09 ENCOUNTER — Ambulatory Visit (INDEPENDENT_AMBULATORY_CARE_PROVIDER_SITE_OTHER): Payer: Self-pay | Admitting: Gastroenterology

## 2012-02-09 DIAGNOSIS — B182 Chronic viral hepatitis C: Secondary | ICD-10-CM

## 2012-02-09 LAB — CBC WITH DIFFERENTIAL/PLATELET
Basophils Absolute: 0 10*3/uL (ref 0.0–0.1)
Basophils Relative: 0 % (ref 0–1)
HCT: 24.9 % — ABNORMAL LOW (ref 36.0–46.0)
Lymphocytes Relative: 32 % (ref 12–46)
MCHC: 30.5 g/dL (ref 30.0–36.0)
Neutro Abs: 3.9 10*3/uL (ref 1.7–7.7)
Neutrophils Relative %: 62 % (ref 43–77)
Platelets: 400 10*3/uL (ref 150–400)
RDW: 17.6 % — ABNORMAL HIGH (ref 11.5–15.5)
WBC: 6.5 10*3/uL (ref 4.0–10.5)

## 2012-02-09 MED ORDER — TRAZODONE HCL 50 MG PO TABS: 100.0000 mg | ORAL_TABLET | Freq: Every day | ORAL | Status: AC

## 2012-02-09 MED ORDER — FE BISGLYCINATE-FE POLYSAC 40-20 MG PO CAPS: 1.0000 | ORAL_CAPSULE | Freq: Two times a day (BID) | ORAL | Status: AC

## 2012-02-16 NOTE — Progress Notes (Signed)
   [name], DIETERICH    MR#:  [id number]      DATE:  02/09/2012  DOB:  December 11, 1960    cc: Consulting Physician: Catalina Antigua, MD, Center for [facility], [address], [place], [place] [address], Fax [phone]  Yancey Flemings, MD, [facility], [address] [place], [address], [place], [place] [address], Fax [phone]  Primary Care Physician:  Same  Referring Physician: Bethanie Dicker, MD, [facility], [address], [place], [place] [address], [place] [phone]  Maylon Cos, MD, Center for [facility]    REASON FOR VISIT:  Followup genotype 3a HCV, week 2 of therapy.   HISTORY:  The patient returns today accompanied by family, including her daughter who acts as Nurse, learning disability. Her start date for therapy was 01/29/2012. She is due to take her third injection on 02/12/2012, this Sunday. She has daily fevers, headaches, and abdominal discomfort for a few days after each injection to date. She also complains of poor sleep, and the family noticed, and the daughter endorses some depression.   She has seen the certified nurse midwife from OB/GYN since last being seen. The daughter reports that there has been ongoing vaginal bleeding.   current medications:  1. Pegasys 180 mcg weekly.  2. Ribavirin 400 mg b.i.d.  3. Multivitamin including 1 containing 26 mg of iron per tablet, which she takes once a day.   ALLERGIES:  Denies.   habits:  Smoking denies. Alcohol denies interval consumption.   REVIEW OF SYSTEMS:  All 10 systems reviewed today with the patient via her daughter and they are negative other which is mentioned above. CES-D was not completed.   PHYSICAL EXAMINATION:  Constitutional: Well appearing. Vital Signs: Height 61 inches, weight 160 pounds, down 3 pounds from previously. Blood pressure 129/78, pulse of 106, temperature 98.3 Fahrenheit.   assessment:  The patient is a 51 year old woman with a history  of genotype 3a hepatitis C who is biochemically well compensated. Her start date for therapy was 01/29/2012. She has completed 2 weeks of therapy. The most significant problem I foresee is that of anemia, considering her dysfunctional uterine bleeding. Her baseline HCV RNA was 23,728 international units per mL or 4.44 log international units per mL.  Today we discussed increasing her iron intake via the use of Niferex or similar type of a supplement twice daily to deliver more iron. In addition, we discussed use of antidepressant. The family indicated they thought she would do better with sleep aid at night rather than an selective serotonin reuptake inhibitor (SSRI) for depression.   PLAN:  1. CBC today.  2. Trazodone 50-100 mg at bedtime, 30 days supply, 60 tablets of 5 refills given.  3. Niferex 40/20 mg b.i.d. to increase iron supplementation.  4. Hepatitis A and B immune.  5. Return in 2 weeks' time for week 4 HCV RNA.               Brooke Dare, MD   ADDENDUM HgB 7.6  will have to apply for assistance to obtain erythropoietin, but will want to keep Pegasys and ribavirin doses as is so early into treatment to try to be sure HCV viral load falls.  [phone] .[phone]  D:  Thu Jun 06 20:49:42 2013 ; T:  Thu Jun 06 23:42:41 2013  Job #:  [id number]

## 2012-02-17 ENCOUNTER — Ambulatory Visit (HOSPITAL_COMMUNITY)
Admission: RE | Admit: 2012-02-17 | Discharge: 2012-02-17 | Disposition: A | Discharge status: Home / Self Care | Payer: Self-pay | Source: Ambulatory Visit | Attending: Physician Assistant | Admitting: Physician Assistant

## 2012-02-17 DIAGNOSIS — N949 Unspecified condition associated with female genital organs and menstrual cycle: Secondary | ICD-10-CM | POA: Insufficient documentation

## 2012-02-17 DIAGNOSIS — N83209 Unspecified ovarian cyst, unspecified side: Secondary | ICD-10-CM | POA: Insufficient documentation

## 2012-02-17 DIAGNOSIS — N938 Other specified abnormal uterine and vaginal bleeding: Secondary | ICD-10-CM | POA: Insufficient documentation

## 2012-02-23 ENCOUNTER — Ambulatory Visit (INDEPENDENT_AMBULATORY_CARE_PROVIDER_SITE_OTHER): Payer: Self-pay | Admitting: Gastroenterology

## 2012-02-23 DIAGNOSIS — B182 Chronic viral hepatitis C: Secondary | ICD-10-CM

## 2012-02-24 LAB — CBC WITH DIFFERENTIAL/PLATELET
Basophils Relative: 0 % (ref 0–1)
HCT: 33.5 % — ABNORMAL LOW (ref 36.0–46.0)
Hemoglobin: 9.8 g/dL — ABNORMAL LOW (ref 12.0–15.0)
Lymphocytes Relative: 34 % (ref 12–46)
MCHC: 29.3 g/dL — ABNORMAL LOW (ref 30.0–36.0)
Monocytes Absolute: 0.4 10*3/uL (ref 0.1–1.0)
Monocytes Relative: 7 % (ref 3–12)
Neutro Abs: 3.3 10*3/uL (ref 1.7–7.7)
Neutrophils Relative %: 58 % (ref 43–77)
RBC: 3.84 MIL/uL — ABNORMAL LOW (ref 3.87–5.11)
WBC: 5.7 10*3/uL (ref 4.0–10.5)

## 2012-02-24 LAB — HEPATIC FUNCTION PANEL
ALT: 12 U/L (ref 0–35)
Bilirubin, Direct: <0.1 mg/dL (ref 0.0–0.3)
Total Bilirubin: 0.3 mg/dL (ref 0.3–1.2)

## 2012-02-24 LAB — HEPATITIS C RNA QUANTITATIVE: HCV Quantitative: NOT DETECTED IU/mL (ref <43)

## 2012-03-01 ENCOUNTER — Other Ambulatory Visit: Payer: Self-pay | Admitting: Gastroenterology

## 2012-03-01 ENCOUNTER — Telehealth: Payer: Self-pay | Admitting: Gastroenterology

## 2012-03-01 NOTE — Progress Notes (Signed)
[name], AUTHIER    MR#:  [id number]      DATE:  02/23/2012  DOB:  09/09/1960    cc: Consulting Physician: Maylon Cos, MD, Center for [facility],  Yancey Flemings, MD, [facility], [address] [place], [address], [place], [place] [address], Fax [phone]  Catalina Antigua, MD, Center for [facility], [address] [place], [place] [address], Fax [phone]  Primary Care Physician: Same.  Referring Physician:  Bethanie Dicker, MD, [facility], [address] [place], [place] [address], VOZ [phone]    REASON FOR VISIT:  Followup genotype 3a HCV week 4 of therapy.   HISTORY:  The patient returns today with her daughter who acts as Nurse, learning disability. Her start date for therapy was 01/29/2012, such that she has taken her fourth injection of interferon on 02/19/2012, and will take her fifth injection on 02/26/2012. 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 I completed the paperwork for patient assistance for erythropoietin at 60,000 units weekly, last week. I have yet to hear back from them. I called during the appointment today to find that she has been approved the next day, but I did not receive any communication from the patient assistance program regarding that.   The patient's daughter reports that her mother is on ferrous sulfate 325 mg t.i.d. for the anemia, having been unable to afford the Niferex. She has some gastrointestinal upset, which she finds somewhat improved with omeprazole but asked for a prescription for Nexium. Her daughter reports the depressive symptoms are better with the Citalopram. Otherwise, no complaints.   PAST MEDICAL HISTORY:  No interval change.   CURRENT MEDICATIONS:  1. Pegasys 180 mcg weekly.  2. Ribavirin 400 mg b.i.d. 3. Ferrous sulfate 325 mg t.i.d.  4. Multivitamin daily.   ALLERGIES:  Denies.    HABITS:  Smoking denies. Alcohol denies interval consumption.   REVIEW OF SYSTEMS:  All 10 systems reviewed today with the patient via her daughter and they are negative other than which was mentioned above. They did not complete a CES-D.   PHYSICAL EXAMINATION:  Constitutional: Well appearing. Vital Signs: Height 61 inches, weight 161 pounds, blood pressure 120/79, pulse of 96, temperature 98.1 Fahrenheit.   LABORATORIES:  On 02/09/2012; Her hemoglobin was 7.6 with an MCV 81.6, platelet count was 400, and white count was 6.5.   ASSESSMENT:  The patient is a 51 year old woman with history of genotype 3a hepatitis C who is biochemically well compensated. Start date for therapy was 01/29/2012. She completed her fourth injection of interferon on 02/19/2012. Most significant problem is that of anemia. I have been able to secure erythropoietin approval to start at 60,000 units weekly to get dosed the same days as her antiHCV therapy. I do not want to reduce the ribavirin dosing for anemia, rather, I would try to maximize her hemoglobin via erythropoietin.   Today, I discussed the anemia and its management with the use of erythropoietin. I have given the patient and her daughter the information regarding the patient assistance program until their pharmacy card arrives in the mail. I have instructed them how to do the injections. We also discussed the use of Nexium for her complaints of abdominal discomfort. I have told her to take it on an empty stomach in the morning to increase absorption.   PLAN:  1. CBC, liver enzymes, week 4 HCV RNA today.  2. Standing CBC to  start next week, and her order is entered.  3. Nexium 40 mg daily on an empty stomach, 30 days supply, 4 refills.  4. Erythropoietin as Procrit 60,000 units divided into 40,000 and 20,000 unit vials subcutaneously weekly, 4 week supply, 4 refills.  5. Hepatitis A and B immune.  6. Return in approximately 3 weeks' time, which will be  the equivalent to week 7 visit, but I will away the week of week 8.             Brooke Dare, MD  ADDENDUM  HCV RNA undetectable at week 4.  On 03/01/12, the daughter called me to report that [facility] refused to draw her mother's CBC claiming that there was no order (or maybe no order from [facility] to have it paid for?).  I faxed orders to [facility]. Ms. Coryell will go on 03/05/12 for labs.  [phone] .[id number]  D:  Thu Jun 20 14:09:09 2013 ; T:  Thu Jun 20 20:26:10 2013  Job #:  [id number]

## 2012-03-01 NOTE — Telephone Encounter (Signed)
The patient's daughter called to say she took her mother to [facility] for her CBC in [place].  The [facility] lab in [facility] either claimed that they did not have an order, despite the entry of a standing order in Epic, or possibly (not clear) that they needed an order from the [facility] for the CBC and would not use my order.  I worked out with the daughter that I would fax an order for a CBC to [facility] that they will pick up on Monday (closed Friday) and use to get the CBC with diff done.  I faxed an order.

## 2012-03-12 ENCOUNTER — Other Ambulatory Visit: Payer: Self-pay | Admitting: Gastroenterology

## 2012-03-12 ENCOUNTER — Other Ambulatory Visit: Payer: Self-pay | Admitting: Physician Assistant

## 2012-03-12 LAB — CBC WITH DIFFERENTIAL/PLATELET
Eosinophils Relative: 2 % (ref 0–5)
Hemoglobin: 10.5 g/dL — ABNORMAL LOW (ref 12.0–15.0)
Lymphocytes Relative: 31 % (ref 12–46)
Lymphs Abs: 1.8 10*3/uL (ref 0.7–4.0)
MCV: 84.6 fL (ref 78.0–100.0)
Monocytes Relative: 8 % (ref 3–12)
Neutrophils Relative %: 59 % (ref 43–77)
Platelets: 401 10*3/uL — ABNORMAL HIGH (ref 150–400)
RBC: 4.29 MIL/uL (ref 3.87–5.11)
WBC: 5.7 10*3/uL (ref 4.0–10.5)

## 2012-03-13 ENCOUNTER — Telehealth: Payer: Self-pay | Admitting: Gastroenterology

## 2012-03-13 NOTE — Telephone Encounter (Signed)
I reviewed the labs from 03/12/12, showing a HgB of 10.5.  [name] would usually dose her Procrit on Sundays with her Pegasys.  She had not taken Procrit on 03/11/12, waiting to get her labs done.  I told the daughter to have [name] take her Procrit 60,000 U next Sunday.  [name] was contacted by the [facility] and given orders for weekly labs to be done at [facility] in [facility].

## 2012-03-22 ENCOUNTER — Ambulatory Visit (INDEPENDENT_AMBULATORY_CARE_PROVIDER_SITE_OTHER): Payer: Self-pay | Admitting: Gastroenterology

## 2012-03-22 DIAGNOSIS — B182 Chronic viral hepatitis C: Secondary | ICD-10-CM

## 2012-03-22 LAB — CBC WITH DIFFERENTIAL/PLATELET
Basophils Absolute: 0 10*3/uL (ref 0.0–0.1)
Basophils Relative: 0 % (ref 0–1)
Eosinophils Absolute: 0.1 10*3/uL (ref 0.0–0.7)
Eosinophils Relative: 2 % (ref 0–5)
MCH: 24.7 pg — ABNORMAL LOW (ref 26.0–34.0)
MCHC: 29.9 g/dL — ABNORMAL LOW (ref 30.0–36.0)
Neutrophils Relative %: 55 % (ref 43–77)
Platelets: 337 10*3/uL (ref 150–400)
RDW: 18.1 % — ABNORMAL HIGH (ref 11.5–15.5)
WBC: 4.8 10*3/uL (ref 4.0–10.5)

## 2012-03-22 LAB — HEPATIC FUNCTION PANEL
ALT: 17 U/L (ref 0–35)
Albumin: 3.9 g/dL (ref 3.5–5.2)
Total Protein: 7.1 g/dL (ref 6.0–8.3)

## 2012-03-23 LAB — TSH: TSH: 1.66 u[IU]/mL (ref 0.350–4.500)

## 2012-03-27 ENCOUNTER — Telehealth: Payer: Self-pay | Admitting: Gastroenterology

## 2012-03-27 NOTE — Telephone Encounter (Signed)
The patient's daughter called to ask about the dose of ribavirin.  Despite a teaching session prior to commencement of treatment and the instructions on the label, the daughter now reports that she has given her mother 200 mg BID from the beginning rather than 400 mg BID.  I explained that her mother's viral load at week 4 was undetectable any way, so I told her to continue with a dose of 400 mg BID and asked her properly dose her mother from now on.

## 2012-03-29 NOTE — Progress Notes (Signed)
[name], [name]    MR#:  [id number]      DATE:  03/22/2012  DOB:  [birth year]    cc: Consulting Physician: Maylon Cos, MD, Center for [facility], [address] [place], [place] [address], Fax [phone]   Yancey Flemings, MD, [facility], [address] [place], [address], [place], [place] [address], Fax [phone]   Catalina Antigua, MD, Center for [facility], [address] [place], [place] [address], Fax [phone]   Primary Care Physician: Same.   Referring Physician: Bethanie Dicker, MD, [facility], [address], [place], [place] [address], [facility]YT [phone]      REASON FOR VISIT:  Followup of genotype 3a HCV week eight of therapy.   HISTORY:  The patient returns today accompanied by her daughter who acts as Nurse, learning disability. Her start date for therapy was 01/29/2012. She is due to take her eighth injection of interferon this Sunday on 03/25/2012. 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 This required the introduction of erythropoietin to her regimen. I have attempted to maintain her on full dose ribavirin to allow for the best opportunity for an SVR. Today, her daughter reports that the side effects that she experienced earlier on in treatment are somewhat better. She still has some myalgias and fevers for which she is taking either Tylenol or ibuprofen as needed. The fatigue is better. There are no symptoms to suggest cryoglobulin mediated or decompensated liver disease.   PAST MEDICAL HISTORY:  No interval change.   CURRENT MEDICATIONS:  1. Multivitamin daily.  2. Ferrous sulfate 325 mg t.i.d. 3. Erythropoietin 60,000 units each Sunday. 4. Pegasys 180 mcg weekly each Sunday. 5. Ribavirin 400 mg b.i.d.  6. Nexium 40 mg daily.   ALLERGIES:  Denies.   HABITS:  Smoking denies. Alcohol denies interval consumption.   REVIEW OF SYSTEMS:  All 10  systems reviewed today with the patient via her daughter, and they are negative other than which is mentioned above. They did not complete the CES-D.   PHYSICAL EXAMINATION:  Constitutional: Well appearing. Vital Signs: Height 61 inches, weight 161 pounds, blood pressure 121/79, pulse 93, temperature 97.3 degrees Fahrenheit.   LABORATORIES:  Laboratory work from 03/12/2012, was 10.5 up from 9.8 on 02/23/2012. White count 5.7, ANC 3.4, platelets 401,000.   ASSESSMENT:  The patient is a 51 year old woman with a history of genotype 3a hepatitis C who is biochemically well compensated. Start date for therapy was 01/29/2012. She is due to take her eighth injection this Sunday on 03/25/2012. Her week four HCV RNA was undetectable. This is all the more reason why I would like to maximize her dose of ribavirin to allow for the best chance of SVR. I will continue the erythropoietin and titrate it against her CBC.   In my discussion today with the patient via her daughter, we discuss symptom management. I have explained to her that she can continue with ibuprofen or acetaminophen, provided she follows the instructions on each of the labels, and not exceed the recommended dose. We discuss continued use of erythropoietin and Pegasys. The daughter asked about injection techniques which I have discussed with her.   PLAN:  1. CBC today in addition to liver enzymes and TSH.  2. Standing CBC to continue. This will be ordered through the [facility] to have it covered at [facility], and the results will be faxed to our office. I have given her  a prescription to continue with weekly CBCs beyond the four orders she already has. The daughter will take this to [facility] [place].  3. Continue with erythropoietin at 60,000 units weekly titrated against CBCs.  4. Hepatitis A and B immune.  5. Return in approximately four weeks' time.            Brooke Dare, MD   [phone]  D:  Thu Jul  18 19:46:36 2013 ; T:  Caleen [place] Jul 19 09:01:39 2013  Job #:  [id number]

## 2012-04-09 ENCOUNTER — Encounter: Payer: Self-pay | Admitting: Physician Assistant

## 2012-04-09 ENCOUNTER — Ambulatory Visit (INDEPENDENT_AMBULATORY_CARE_PROVIDER_SITE_OTHER): Payer: Self-pay | Admitting: Physician Assistant

## 2012-04-09 VITALS — BP 138/87 | HR 99 | Temp 98.7°F | Ht 60.0 in | Wt 160.3 lb

## 2012-04-09 DIAGNOSIS — N760 Acute vaginitis: Secondary | ICD-10-CM

## 2012-04-09 DIAGNOSIS — Z01812 Encounter for preprocedural laboratory examination: Secondary | ICD-10-CM

## 2012-04-09 DIAGNOSIS — N393 Stress incontinence (female) (male): Secondary | ICD-10-CM

## 2012-04-09 NOTE — Progress Notes (Signed)
Language Resources- Riffat Hussan

## 2012-04-09 NOTE — Progress Notes (Signed)
Chief Complaint:  Procedure, Urinary Frequency, Ovarian Cyst and Vaginal Discharge   [name] is  51 y.o. [id number].  Patient's last menstrual period was 03/26/2012..  She presents complaining of Procedure, Urinary Frequency, Ovarian Cyst and Vaginal Discharge  Pt presents for results of FU ultrasound and endometrial bx. Pt states she does not want endo bx secondary to single episode of heavy bleeding with period in March. Reports regular monthly periods lasting for 3 days without clots. States urinary incontinence with cough, sneezing, and laughing x 2-3 years and white vaginal discharge without odor, color, or irritation since pelvic US in June. Pt was widowed 1 year ago, not currently sexually active.  Obstetrical/Gynecological History: OB History    Grav Para Term Preterm Abortions TAB SAB Ect Mult Living   8 4 4  4 4    4       Past Medical History: Past Medical History  Diagnosis Date  . Anemia   . Hepatitis C   . Chronic liver disease   . Anxiety   . GERD (gastroesophageal reflux disease)   . Depression   . Diabetes mellitus   . Hepatitis C     Dx 20 yrs ago in [place]    Past Surgical History: Past Surgical History  Procedure Date  . None     Family History: Family History  Problem Relation Age of Onset  . Liver disease    . Colon cancer Neg Hx   . Liver disease Mother   . Diabetes Mother   . Cancer Mother     liver  . Liver disease Father   . Hypertension Brother   . Breast cancer Sister     Social History: History  Substance Use Topics  . Smoking status: Never Smoker   . Smokeless tobacco: Never Used  . Alcohol Use: No    Allergies: No Known Allergies  Review of Systems - Negative except what has been reviewed in HPI  Physical Exam   Blood pressure 138/87, pulse 99, temperature 98.7 F (37.1 C), temperature source Oral, height 5' (1.524 m), weight 160 lb 4.8 oz (72.712 kg), last menstrual period 03/26/2012.  General: General appearance -  alert, well appearing, and in no distress, oriented to person, place, and time and overweight Mental status - alert, oriented to person, place, and time, normal mood, behavior, speech, dress, motor activity, and thought processes, affect appropriate to mood Abdomen - soft, nontender, nondistended, no masses or organomegaly Focused Gynecological Exam: VULVA: normal appearing vulva with no masses, tenderness or lesions, VAGINA: PELVIC FLOOR EXAM: no cystocele, rectocele or prolapse noted, rectocele grade 2, cystocele grade 1, CERVIX: normal appearing cervix without discharge or lesions, UTERUS: uterus is normal size, shape, consistency and nontender, ADNEXA: normal adnexa in size, nontender and no masses  Imaging:  RADIOLOGY REPORT*  Clinical Data: Follow-up right ovarian cyst, dysfunctional uterine  bleeding.  TRANSVAGINAL ULTRASOUND OF PELVIS  Technique: Transvaginal ultrasound examination of the pelvis was  performed including evaluation of the uterus, ovaries, adnexal  regions, and pelvic cul-de-sac.  Comparison: 11/16/2011  Findings:  Uterus: Retroverted, retroflexed. 10.1 x 6.8 x 5.0 cm. No focal  abnormality.  Endometrium: Uterine fundus not well visualized due to  retroflexion. Endometrial stripe appears mildly thickened at 2.5  cm without focal abnormality.  Right ovary: 3.7 x 3.1 x 2.4 cm. Collapsing cyst now measures 2.2  x 1.9 x 1.5 cm. This is smaller than previously.  Left ovary: 3.0 x 2.8 x 2.3 cm. Normal.  Other  Findings: No free fluid  IMPRESSION:  Mildly thickened endometrial stripe, most likely related to timing  in the patient's menstrual cycle. Given the history of  dysfunctional uterine bleeding, consider follow-up during the week  following the patient's normal menses in 6-8 weeks to document  resolution.  Resolving right ovarian cyst.  Original Report Authenticated By: Harrel Lemon, M.D.  Assessment: 1. Pre-procedure lab exam   2. Vaginitis   3. Stress  incontinence, female      Plan: Reviewed above [place] report Wet prep obtain and sent Urine Culture pending Kegel Exercises daily Pt declines endo biopsy today. RTC in 3 months for recheck  Allison Deshotels E. 04/09/2012,4:03 PM

## 2012-04-09 NOTE — Patient Instructions (Addendum)
Patient information: Pelvic muscle (Kegel) exercises (The Basics)View in SpanishWritten by the doctors and editors at UpToDate  What are pelvic muscle exercises? - Pelvic muscle exercises are exercises that strengthen the muscles that control the flow of urine and bowel movements. These exercises are also known as "Kegel" exercises. They can help keep you from leaking urine, gas, or bowel movements, if leaks are a problem for you. They can also help with a condition that affects women called "pelvic organ prolapse." In women who have pelvic organ prolapse, the organs in the lower belly drop down and press against or bulge into the vagina.  How do I learn how to do Kegel exercises? - First ask your doctor or nurse how to do them right. He or she can help you get started.  You will need to learn which muscles to tighten. It is sometimes hard to figure out the right muscles.  A woman might learn to do Kegel exercises by:  Putting a finger inside her vagina and squeezing the muscles around her finger; or  Pretending that she is sitting on a marble and has to pick up the marble using her vagina A man might learn to do Kegels by tightening his butt muscles as if he were holding back gas.  Both men and women can also learn to do Kegel exercises by stopping and starting the flow of urine. If you do this, make sure to do this only once or twice to figure out the correct muscles. Some doctors think you should not do this at all, because if you get in the habit of doing it, it could damage your bladder.  No matter how you learn to do Kegel exercises, the important thing to know is that the muscles involved are not in your belly or your thighs.  After you learn which muscles to tighten, you can do the exercises in any position (sitting in a chair or lying down). You do not need to do them while you are in the bathroom.  How often should I do the exercises? - Do the exercises 3 times a day, on 3 or 4 days a week. Each  time, flex your muscles 8 to 12 times, and hold them tight for 6 to 8 seconds each time you tighten. Keep up this routine for at least 3 to 4 months. You will probably see results, but it might take a little time.  How do Kegel exercises help? - Kegel exercises can help: Reduce urine leaks in people who have "stress incontinence," which means they leak urine when they cough, laugh, sneeze, or strain  Control sudden urges to urinate that happen to people with "urgency incontinence." (Urgency incontinence is also known as urge incontinence.)  Control the release of gas or bowel movements  Reduce pressure or bulging in the vagina caused by pelvic organ prolapse. (If you have a bulge in the vagina, see your doctor or nurse to find out the cause.) Kegel exercises might also reduce urine leaks in men who have had surgery to treat prostate cancer or an enlarged prostate    Urinary Incontinence Your doctor wants you to have this information about urinary incontinence. This is the inability to keep urine in your body until you decide to release it. CAUSES  Prostate gland enlargement is a common cause of urinary incontinence. But there are many different causes for losing urinary control. They include:  Medicines.   Infections.   Prostate problems.   Surgery.  Neurological diseases.   Emotional factors.  DIAGNOSIS  Evaluating the cause of incontinence is important in choosing the best treatment. This may require:  An ultrasound exam.   Kidney and bladder X-rays.   Cystoscopy. This is an exam of the bladder using a narrow scope.  TREATMENT  For incontinent patients, normal daily hygiene and using changing pads or adult diapers regularly will prevent offensive odors and skin damage from the moisture. Changing your medicines may help control incontinence. Your caregiver may prescribe some medicines to help you regain control. Avoid caffeine. It can over-stimulate the bladder. Use the bathroom  regularly. Try about every 2 to 3 hours even if you do not feel the need. Take time to empty your bladder completely. After urinating, wait a minute. Then try to urinate again. External devices used to catch urine or an indwelling urine catheter (Foley catheter) may be needed as well. Some prostate gland problems require surgery to correct. Call your caregiver for more information. Document Released: 09/29/2004 Document Revised: 08/11/2011 Document Reviewed: 09/24/2008 [facility] Patient Information 2012 [place], [place].

## 2012-04-10 LAB — WET PREP, GENITAL
Clue Cells Wet Prep HPF POC: NONE SEEN
Trich, Wet Prep: NONE SEEN

## 2012-04-12 ENCOUNTER — Other Ambulatory Visit: Payer: Self-pay | Admitting: Physician Assistant

## 2012-04-12 ENCOUNTER — Ambulatory Visit (INDEPENDENT_AMBULATORY_CARE_PROVIDER_SITE_OTHER): Payer: Self-pay | Admitting: Gastroenterology

## 2012-04-12 ENCOUNTER — Telehealth: Payer: Self-pay | Admitting: *Deleted

## 2012-04-12 DIAGNOSIS — B182 Chronic viral hepatitis C: Secondary | ICD-10-CM

## 2012-04-12 MED ORDER — AMOXICILLIN 500 MG PO CAPS: 500.0000 mg | ORAL_CAPSULE | Freq: Three times a day (TID) | ORAL | Status: AC

## 2012-04-12 NOTE — Telephone Encounter (Signed)
Message copied by Mannie Stabile on Thu Apr 12, 2012  3:07 PM ------      Message from: Maylon Cos E      Created: Thu Apr 12, 2012  2:32 PM       Will treat secondary to incontinence. Rx sent to pharmacy. Please call patient with result.

## 2012-04-12 NOTE — Telephone Encounter (Signed)
Called patient using pacific interpreter urdu [place] interpreter # [phone]. Pt informed of uti, will pick up her medicine.

## 2012-04-19 NOTE — Progress Notes (Signed)
REFERRING PHYSICIAN:  Bethanie Dicker, M.D., [facility], [place]. [address] [place], [place] [address], fax [phone].  primary care physician:   Bethanie Dicker, M.D., [facility], [place]. [address] [place], [place] [address], fax [phone].  consulting physician:  Tamsen Roers, M.D. [facility], [address] [place], [place] [address], fax [phone].  Yancey Flemings, M.D., [facility], [address], [place], [place] [address], fax [phone].  Maylon Cos, M.D., [facility], [address] [place], [place] [address], fax [phone].  REASON FOR VISIT:  Follow up of genotype 3a hepatitis C, week 12 of therapy.   HISTORY:  The patient returns today accompanied by her daughter who acts as Nurse, learning disability. Her start date for therapy was 01/29/2012. She is due to take her twelfth injection of interferon this Sunday on 04/15/2012. 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 The patient is having her CBCs monitored on a weekly basis at [facility] in [facility]. Because she is uninsured, the orders comes to the [facility] of [address] Unfortunately, they are not sending me the results. I have discussed this in the past with the patient's daughter. The patient continues to have myalgias and arthralgias, but otherwise is tolerating treatment well. She finds Voltaren gel helpful for the arthralgias and myalgias as well as heating pads.   PAST MEDICAL HISTORY:  No interval change.   CURRENT MEDICATIONS:  1. Multivitamin daily.  2. Ferrous sulfate 325 mg t.i.d.  3. Erythropoietin 60,000 units each Sunday. 4. Pegasys 180 mcg each Sunday. 5. Ribavirin 400 mg b.i.d.  6. Nexium 40 mg daily.   ALLERGIES:  Denies.   HABITS:  Smoking denies. Alcohol interval consumption denies.   REVIEW OF SYSTEMS:  All 10 systems reviewed today with the patient's  daughter. They are negative other than which is mentioned above. They did not complete the CES-D.   PHYSICAL EXAMINATION:  Constitutional: Well appearing. Vital signs: Height 61 inches, weight 161 pounds, blood pressure 106/77, pulse 102, temperature 98.1 degrees Fahrenheit.   LABORATORIES:  I have not received any labs since 03/22/2012.   ASSESSMENT:  The patient is a 51 year old woman with a history of genotype 3a hepatitis C who is biochemically well compensated. Her start date for therapy was 01/29/2012. She is due to take her twelfth injection this Sunday on 04/15/2012. Her week 4 HCV RNA was undetectable. She is taking the erythropoietin for ribavirin induced anemia, but I do not have CBCs as mentioned above.   In my discussion today with the patient via her daughter, I encouraged her to continue with her symptomatic management of her arthralgias. I explained to the daughter she must contact [facility] and have them faxed the patient's labs to our office. She asked about going to every other week CBCs, and I thought this was reasonable.   PLAN:  1. I have asked the patient's daughter to have [facility] fax me the labs.  2. Continue standing CBC, but to reduce the frequency to every other week.  3. Hepatitis A and B immune.  4. Return in 4 weeks' time.           Brooke Dare, MD   ADDENDUM:  04/09/12  WBC 5.9  HgB 11.7  Plts 284  [phone]  D:  Thu Aug 08 18:45:27 2013 ; T:  Fri Aug 09 07:50:06 2013  Job #:  [id number]

## 2012-04-26 ENCOUNTER — Telehealth: Payer: Self-pay | Admitting: Gastroenterology

## 2012-04-26 NOTE — Telephone Encounter (Signed)
HgB on 04/20/12 was 12.8.  Called daughter to reduce Procrit from 60,000 U weekly (given tomorrow, each Friday) to 40,000 U weekly.  CBC to be done at [facility] next week and appt on Sept 19, 2013.

## 2012-05-17 ENCOUNTER — Ambulatory Visit (INDEPENDENT_AMBULATORY_CARE_PROVIDER_SITE_OTHER): Payer: Self-pay | Admitting: Gastroenterology

## 2012-05-17 DIAGNOSIS — B182 Chronic viral hepatitis C: Secondary | ICD-10-CM

## 2012-05-24 NOTE — Progress Notes (Signed)
[name], [name]    MR#:  [id number]      DATE:  05/17/2012  DOB:  05/07/1961         referring physician:   Bethanie Dicker, MD, [facility], [address] [place], [place] [address], fax [phone].   primary care physician:   Cheron Schaumann, MD, [facility], [address], [place], [place] [address], fax [phone].  CONSULTING PHYSICIAN:   Yancey Flemings, MD, [facility], [address] [place], [address], [place], [place] [address], fax [phone].    Maylon Cos, MD, [facility], [address] [place], [place] [address], fax [phone].   reason for visit:   Followup of genotype 3A hepatitis C, week 16 of therapy.   HISTORY:  The patient returns today accompanied by her daughter and other family. The start date for therapy was 01/29/2012. She is due to take her 16th injection on Sunday, 05/20/2012. 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 I am monitoring her CBCs every other week now at [facility] in [facility] with recovery of her hemoglobin. She complains of arthralgias and myalgias. A family member who is also a family physician has given her a prescription for diclofenac to be used on a p.r.n. basis. They asked me if this is okay. She also brings a Cox 2 inhibitor from [place] that we do not have available to [place] on the market in the [place] and a co-formulation of acetaminophen at 650 mg co-formulated with an equivalent to Phenergan at 50 mg asking if she could use this as well.   PAST MEDICAL HISTORY:  No interval change.   CURRENT MEDICATIONS:  1. Multivitamin daily.  2. Ferrous sulfate 325 t.i.d.  3. Pegasys 180 mcg each Sunday.  4. Ribavirin 400 mg b.i.d.  5. Nexium 40 mg daily.  6. Diclofenac p.r.n.  7. Erythropoietin 40,000 units each Sunday.   ALLERGIES:  Denies.   HABITS:  Smoking denies. Alcohol denies interval  consumption.   REVIEW OF SYSTEMS:  All 10 systems reviewed today with the patient via her daughter and this is negative other than which was mentioned above. She did not complete the CSD as usual.   PHYSICAL EXAMINATION:  Constitutional: Well appearing. Vital Signs: Height 61 inches, weight 164 pounds, blood pressure 127/91, pulse of 86, temperature 95.6 Fahrenheit.   LABORATORIES:  Laboratory work from 05/08/2012, white count 6.2, hemoglobin 12, MCV 81.6, platelet count 290, ANC 3.8.   ASSESSMENT:  The patient is a 51 year old woman with a history of genotype 3A hepatitis C who is biochemically well compensated. Her start date for therapy was 01/29/2012. She is due to take her sixteenth injection this Sunday on 05/20/2012. A week 4 HCV RNA was undetectable. She required erythropoietin for ribavirin induced anemia and has responded to the point where I can back down on the dose of the erythropoietin.   In my discussion today with the patient and her family, who accompanied her today, I explained to her that she should not take the Cox-2 inhibitor from [place] in combination with diclofenac as they are the same class. I explained that she could use that or the acetaminophen formulation from [place], but to follow the instructions regarding the acetaminophen formulation from [place] which I read with them today. I recommended the diclofenac though because NSAIDs may be somewhat more effective. I have explained to the patient and  her family that she has another 8 weeks of therapy to go. I have asked her to obtain a CBC every other week through the [facility] [place].   PLAN:  1. She attends the [facility] next week for a CBC with differential and to present every other week.  2. Hepatitis A and B immune.  3. I have written to the [facility] to indicate that all labs should now be faxed to our [facility] office at [phone] and given them my  phone number as well as this clinic is closing within 2 weeks.  4. She will return again in 4 weeks' time with arrangements to have her seen either in [place] or at [facility] if [facility] is able to accommodate uninsured patients.              Brooke Dare, MD   [phone]  D:  Thu Sep 12 17:33:57 2013 ; T:  Caleen Essex Sep 13 09:44:33 2013  Job #:  [id number]

## 2012-10-08 ENCOUNTER — Encounter: Payer: Self-pay | Admitting: Physician Assistant

## 2012-10-08 DIAGNOSIS — N938 Other specified abnormal uterine and vaginal bleeding: Secondary | ICD-10-CM | POA: Insufficient documentation

## 2012-10-08 NOTE — Progress Notes (Signed)
History:  52 y.o. [id number] here today for evaluation of DUB.  No prior evaluation. No signs/symptoms of anemia. No other GYN concerns.  The following portions of the patient's history were reviewed and updated as appropriate: allergies, current medications, past family history, past medical history, past social history, past surgical history and problem list.  Review of Systems:  Pertinent items are noted in HPI.  Objective:  Physical Exam BP 122/79  Pulse 113  Temp 97.6 F (36.4 C) (Oral)  Resp 16  Ht 5' (1.524 m)  Wt 160 lb 14.4 oz (72.984 kg)  BMI 31.42 kg/m2  LMP 01/04/2012 Gen: NAD Abd: Soft, nontender and nondistended Pelvic: Deferred  Assessment & Plan:  Pelvic ultrasound ordered; will follow up results and manage accordingly.  Return for endometrial biopsy.

## 2012-11-05 ENCOUNTER — Encounter: Payer: Self-pay | Admitting: *Deleted

## 2012-11-05 NOTE — Progress Notes (Signed)
Certified letter returned

## 2013-02-25 ENCOUNTER — Ambulatory Visit (INDEPENDENT_AMBULATORY_CARE_PROVIDER_SITE_OTHER): Payer: Self-pay | Admitting: Family Medicine

## 2013-02-25 ENCOUNTER — Other Ambulatory Visit (HOSPITAL_COMMUNITY)
Admission: RE | Admit: 2013-02-25 | Discharge: 2013-02-25 | Disposition: A | Discharge status: Home / Self Care | Payer: Self-pay | Source: Ambulatory Visit | Attending: Obstetrics & Gynecology | Admitting: Obstetrics & Gynecology

## 2013-02-25 ENCOUNTER — Encounter: Payer: Self-pay | Admitting: Family Medicine

## 2013-02-25 VITALS — BP 126/80 | HR 82 | Temp 97.6°F | Ht 60.0 in | Wt 170.4 lb

## 2013-02-25 DIAGNOSIS — N949 Unspecified condition associated with female genital organs and menstrual cycle: Secondary | ICD-10-CM | POA: Insufficient documentation

## 2013-02-25 DIAGNOSIS — N938 Other specified abnormal uterine and vaginal bleeding: Secondary | ICD-10-CM | POA: Insufficient documentation

## 2013-02-25 DIAGNOSIS — B373 Candidiasis of vulva and vagina: Secondary | ICD-10-CM

## 2013-02-25 DIAGNOSIS — N83209 Unspecified ovarian cyst, unspecified side: Secondary | ICD-10-CM

## 2013-02-25 DIAGNOSIS — N83201 Unspecified ovarian cyst, right side: Secondary | ICD-10-CM

## 2013-02-25 MED ORDER — IBUPROFEN 600 MG PO TABS: 600.0000 mg | ORAL_TABLET | Freq: Four times a day (QID) | ORAL | Status: AC | PRN

## 2013-02-25 MED ORDER — FLUCONAZOLE 150 MG PO TABS: 150.0000 mg | ORAL_TABLET | ORAL | Status: DC

## 2013-02-25 NOTE — Progress Notes (Signed)
Patient ID: [name], female   DOB: 04/29/1961, 52 y.o.   MRN: [id number]  S:  Pt here for f/u of ovarian cyst, right side. First seen in March 2013, was slightly decreased in size on 6/13. States she was recently seen at [facility] and had U/S done showing cyst even smaller but still present (1 to 2 months ago). Denies pelvic pain except for an occasional poking R pelvic pain, does not last long. Endometrial stripe on u/s 3/13 was 17 mm, but 25 mm in 6/13. Normal TSH and CBC in July 2013. Denies nausea, vomiting, fever, weight loss, fever, diarrhea, constipation, palpitations, dizziness, SOB. She does c/o vaginal discharge and itching.  LMP - 2 months ago just a small amount of bleeding - one day. Now 3 months without period. Prior to that was regular but then also had some spotting in between periods. Was last seen by Maylon Cos in August 2013 and told she needed an endometrial biopsy. She declined at that time and was scheduled for f/u ultrasound, which she never had done.   I have reviewed recent progress notes, labs, medications, allergies, past medical and social histories, as well as medications and allergies.  Past Medical History  Diagnosis Date  . Anemia   . Hepatitis C   . Chronic liver disease   . Anxiety   . GERD (gastroesophageal reflux disease)   . Depression   . Diabetes mellitus   . Hepatitis C     Dx 20 yrs ago in [place]   Past Surgical History  Procedure Laterality Date  . None     Family History  Problem Relation Age of Onset  . Liver disease    . Colon cancer Neg Hx   . Liver disease Mother   . Diabetes Mother   . Cancer Mother     liver  . Liver disease Father   . Hypertension Brother   . Breast cancer Sister    History   Social History  . Marital Status: Widowed    Spouse Name: N/A    Number of Children: N/A  . Years of Education: N/A   Occupational History  . unemployed    Social History Main Topics  . Smoking status: Never Smoker    . Smokeless tobacco: Never Used  . Alcohol Use: No  . Drug Use: No  . Sexually Active: No   Other Topics Concern  . Not on file   Social History Narrative  . No narrative on file   ROS:  See HPI  O:   Filed Vitals:   02/25/13 1332  BP: 126/80  Pulse: 82  Temp: 97.6 F (36.4 C)   GEN:  WNWD, no distress HEENT:  NCAT, EOMI, conjunctiva clear NECK:  Supple, non-tender, no thyromegaly, trachea midline CV: RRR, no murmur RESP:  CTAB ABD:  Soft, non-tender, no guarding or rebound, normal bowel sounds EXTREM:  Warm, well perfused, no edema or tenderness NEURO:  Alert, oriented, no focal deficits GU:  Normal external genitalia, normal vagina. Normal parous cervix.   A/P 52 y.o. [id number] with 1.  Right ovarian cyst - resolving since last year. Repeat ultrasound in 1-2 months 2.  Dysfunctional bleeding: mildly increased endometrial stripe in 02/2012.  EMB done today. Get ultrasound report from [facility]. 3.  Vaginal yeast infection - Diflucan  ENDOMETRIAL BIOPSY Patient given informed consent, signed copy in the chart, time out was performed. Urine pregnancy test performed - negative. 800 mg of Motrin was given  PO prior to procedure. Appropriate time out taken. The patient was placed in the lithotomy position and the cervix brought into view with sterile speculum.  Portio of cervix cleansed x 2 with betadine swabs.  A tenaculum was placed in the anterior lip of the cervix.  The uterus was sounded for depth of 10 cm. A pipelle was introduced to into the uterus, suction created,  and an endometrial sample was obtained. All equipment was removed and accounted for.  The patient tolerated the procedure well.    Patient given post procedure instructions. The patient will return in 2 weeks for results of EMB and ultrasound.

## 2013-02-25 NOTE — Patient Instructions (Signed)
Endometrial Biopsy This is a test in which a tissue sample (a biopsy) is taken from inside the uterus (womb). It is then looked at by a specialist under a microscope to see if the tissue is normal or abnormal. The endometrium is the lining of the uterus. This test helps determine where you are in your menstrual cycle and how hormone levels are affecting the lining of the uterus. Another use for this test is to diagnose endometrial cancer, tuberculosis, polyps, or inflammatory conditions and to evaluate uterine bleeding. PREPARATION FOR TEST No preparation or fasting is necessary. NORMAL FINDINGS No pathologic conditions. Presence of "secretory-type" endometrium 3 to 5 days before to normal menstruation. Ranges for normal findings may vary among different laboratories and hospitals. You should always check with your doctor after having lab work or other tests done to discuss the meaning of your test results and whether your values are considered within normal limits. MEANING OF TEST  Your caregiver will go over the test results with you and discuss the importance and meaning of your results, as well as treatment options and the need for additional tests if necessary. OBTAINING THE TEST RESULTS It is your responsibility to obtain your test results. Ask the lab or department performing the test when and how you will get your results. Document Released: 12/23/2004 Document Revised: 11/14/2011 Document Reviewed: 08/01/2008 [facility] Patient Information 2014 [place], [place].  Call if you have heavy bleeding (more than one pad per hour) or cramping not controlled by motrin.  Ovarian Cyst An ovarian cyst is a sac filled with fluid or blood. This sac is attached to the ovary. Some cysts go away on their own. Other cysts need treatment.  HOME CARE   Only take medicine as told by your doctor.  Follow up with your doctor as told. GET HELP RIGHT AWAY IF:   You develop sudden pain.  Your belly (abdomen)  becomes large or puffy (swollen).  You have a hard time peeing (totally emptying your bladder).  You feel sick most of the time.  You have a temperature by mouth above 102 F (38.9 C), not controlled by medicine.  Your periods are late, not regular, or painful.  Your belly or pelvic pain does not go away.  You have pressure on your bladder.  You have pain during sex.  You feel fullness, pressure, or discomfort in your belly.  You lose weight for no reason. MAKE SURE YOU:   Understand these instructions.  Will watch your condition.  Will get help right away if you are not doing well or get worse. Document Released: 02/08/2008 Document Revised: 11/14/2011 Document Reviewed: 07/24/2009 [facility] Patient Information 2014 [place], [place].

## 2013-02-26 ENCOUNTER — Encounter: Payer: Self-pay | Admitting: Family Medicine

## 2013-03-11 ENCOUNTER — Encounter: Payer: Self-pay | Admitting: *Deleted

## 2013-04-09 ENCOUNTER — Telehealth: Payer: Self-pay

## 2013-04-09 NOTE — Telephone Encounter (Signed)
Pt daughter called wanting results from a test that was done the 23rd.

## 2013-04-10 NOTE — Telephone Encounter (Addendum)
Returned call to pt's daughterArley Phenix @ [phone] and left message that I am calling to discuss results. (pt has given permission) please call back and state whether this information can be left on your voice mail.  **Note: pt had benign endometrial biopsy. Per MD note, pt was supposed to have follow up clinic appt to discuss results, plan of care and may need repeat US.  [phone] - new message left by pt's daughter stating that we can leave detailed test result information on her voice mail.  I returned her call and informed her of the endometrial biopsy results. I also stated that her mother needs follow up clinic appt to discuss results and plan of care. Pt's daughter Arley Phenix) then stated that the appt needs to be before 9/6 as they will be leaving the country. I stated that someone will call her with appt details.

## 2013-05-08 ENCOUNTER — Encounter: Payer: Self-pay | Admitting: Obstetrics and Gynecology

## 2013-05-08 ENCOUNTER — Ambulatory Visit (INDEPENDENT_AMBULATORY_CARE_PROVIDER_SITE_OTHER): Payer: BC Managed Care – PPO | Admitting: Obstetrics and Gynecology

## 2013-05-08 VITALS — BP 126/84 | HR 85 | Temp 97.1°F | Ht 61.0 in | Wt 168.8 lb

## 2013-05-08 DIAGNOSIS — Z7189 Other specified counseling: Secondary | ICD-10-CM

## 2013-05-08 DIAGNOSIS — N8300 Follicular cyst of ovary, unspecified side: Secondary | ICD-10-CM

## 2013-05-08 DIAGNOSIS — Z712 Person consulting for explanation of examination or test findings: Secondary | ICD-10-CM

## 2013-05-08 MED ORDER — ESOMEPRAZOLE MAGNESIUM 40 MG PO CPDR: 40.0000 mg | DELAYED_RELEASE_CAPSULE | Freq: Every day | ORAL | Status: AC

## 2013-05-08 MED ORDER — CALCIUM-VITAMIN D 500-200 MG-UNIT PO TABS: 1.0000 | ORAL_TABLET | Freq: Two times a day (BID) | ORAL | Status: AC

## 2013-05-08 NOTE — Progress Notes (Signed)
Patient ID: [name], female   DOB: June 26, 1961, 52 y.o.   MRN: [id number] 52 yo [id number] presenting today to discuss results of endometrial biopsy. Benign results reviewed and explained. Patient has not had any further bleeding issues. She reports her LMP was 2 months ago and she bled for a 3-4 days.  Explained to patient that she is likely in perimenopausal stage. Reviewed 01/2013 ultrasound which showed a 1.7 cm physiologic cyst on right ovary. Reassurance provided.  RTC prn

## 2013-07-11 ENCOUNTER — Other Ambulatory Visit: Payer: Self-pay

## 2014-07-07 ENCOUNTER — Encounter: Payer: Self-pay | Admitting: Obstetrics and Gynecology

## 2017-04-09 ENCOUNTER — Encounter (HOSPITAL_BASED_OUTPATIENT_CLINIC_OR_DEPARTMENT_OTHER): Payer: Self-pay | Admitting: Respiratory Therapy

## 2017-04-09 ENCOUNTER — Emergency Department (HOSPITAL_BASED_OUTPATIENT_CLINIC_OR_DEPARTMENT_OTHER)
Admission: EM | Admit: 2017-04-09 | Discharge: 2017-04-09 | Disposition: A | Discharge status: Home / Self Care | Payer: BLUE CROSS/BLUE SHIELD | Attending: Emergency Medicine | Admitting: Emergency Medicine

## 2017-04-09 ENCOUNTER — Emergency Department (HOSPITAL_BASED_OUTPATIENT_CLINIC_OR_DEPARTMENT_OTHER): Payer: BLUE CROSS/BLUE SHIELD

## 2017-04-09 DIAGNOSIS — Z79899 Other long term (current) drug therapy: Secondary | ICD-10-CM | POA: Insufficient documentation

## 2017-04-09 DIAGNOSIS — R112 Nausea with vomiting, unspecified: Secondary | ICD-10-CM | POA: Diagnosis not present

## 2017-04-09 DIAGNOSIS — I10 Essential (primary) hypertension: Secondary | ICD-10-CM | POA: Diagnosis not present

## 2017-04-09 DIAGNOSIS — R51 Headache: Secondary | ICD-10-CM | POA: Diagnosis not present

## 2017-04-09 DIAGNOSIS — E119 Type 2 diabetes mellitus without complications: Secondary | ICD-10-CM | POA: Diagnosis not present

## 2017-04-09 DIAGNOSIS — R519 Headache, unspecified: Secondary | ICD-10-CM

## 2017-04-09 LAB — CBC WITH DIFFERENTIAL/PLATELET
BASOS ABS: 0 10*3/uL (ref 0.0–0.1)
BASOS PCT: 0 %
EOS ABS: 0.1 10*3/uL (ref 0.0–0.7)
Eosinophils Relative: 1 %
HEMATOCRIT: 34.9 % — AB (ref 36.0–46.0)
HEMOGLOBIN: 11.1 g/dL — AB (ref 12.0–15.0)
Lymphocytes Relative: 19 %
Lymphs Abs: 1.6 10*3/uL (ref 0.7–4.0)
MCH: 25.1 pg — ABNORMAL LOW (ref 26.0–34.0)
MCHC: 31.8 g/dL (ref 30.0–36.0)
MCV: 79 fL (ref 78.0–100.0)
MONO ABS: 0.5 10*3/uL (ref 0.1–1.0)
Monocytes Relative: 6 %
NEUTROS ABS: 6.6 10*3/uL (ref 1.7–7.7)
NEUTROS PCT: 74 %
Platelets: 393 10*3/uL (ref 150–400)
RBC: 4.42 MIL/uL (ref 3.87–5.11)
RDW: 14.6 % (ref 11.5–15.5)
WBC: 8.8 10*3/uL (ref 4.0–10.5)

## 2017-04-09 LAB — COMPREHENSIVE METABOLIC PANEL
ALK PHOS: 67 U/L (ref 38–126)
ALT: 13 U/L — ABNORMAL LOW (ref 14–54)
ANION GAP: 8 (ref 5–15)
AST: 21 U/L (ref 15–41)
Albumin: 4.1 g/dL (ref 3.5–5.0)
BILIRUBIN TOTAL: 0.2 mg/dL — AB (ref 0.3–1.2)
BUN: 9 mg/dL (ref 6–20)
CALCIUM: 9.1 mg/dL (ref 8.9–10.3)
CO2: 27 mmol/L (ref 22–32)
Chloride: 100 mmol/L — ABNORMAL LOW (ref 101–111)
Creatinine, Ser: 0.68 mg/dL (ref 0.44–1.00)
GFR calc Af Amer: >60 mL/min (ref >60)
Glucose, Bld: 182 mg/dL — ABNORMAL HIGH (ref 65–99)
POTASSIUM: 3.9 mmol/L (ref 3.5–5.1)
Sodium: 135 mmol/L (ref 135–145)
Total Protein: 7.4 g/dL (ref 6.5–8.1)

## 2017-04-09 LAB — LIPASE, BLOOD: LIPASE: 44 U/L (ref 11–51)

## 2017-04-09 IMAGING — CT CT HEAD W/O CM
3 series · 15 of 47 positions shown, 18 images · non-contrast
Comparison: None.

CLINICAL DATA: Severe headache with nausea and vomiting beginning
yesterday. Hypertension.

EXAM:
CT HEAD WITHOUT CONTRAST
TECHNIQUE: Contiguous axial images were obtained from the base of the skull
through the vertex without intravenous contrast.

[Series 2: head wo · axial · 0.40mm/px · z∈[-172,-47]mm · 9 of 30 slices shown, 12 images]
[im 3/30  brain]
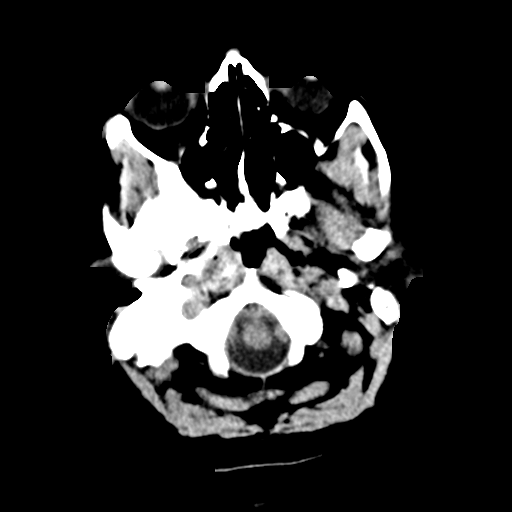
[im 3/30  bone]
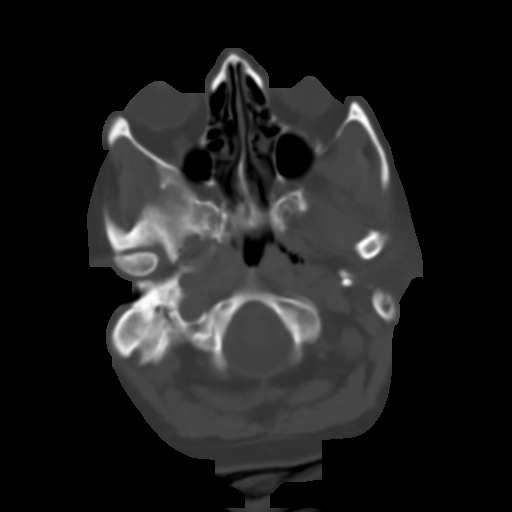
[im 6/30  brain]
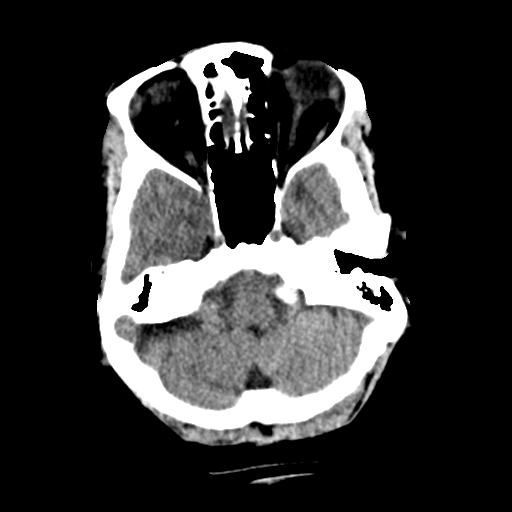
[im 9/30  brain]
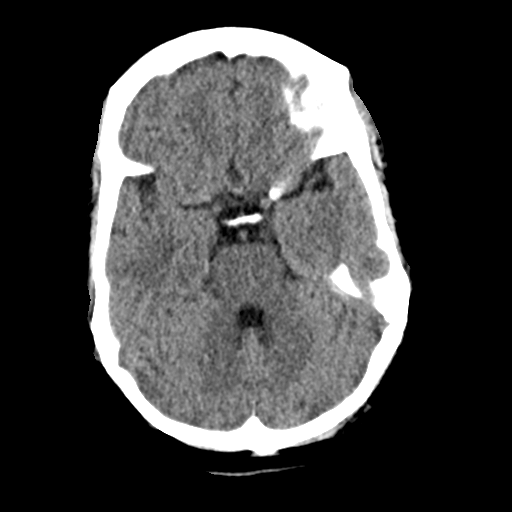
[im 12/30  brain]
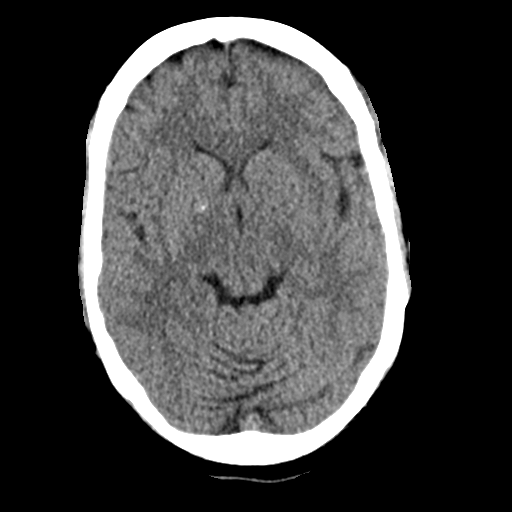
[im 16/30  brain]
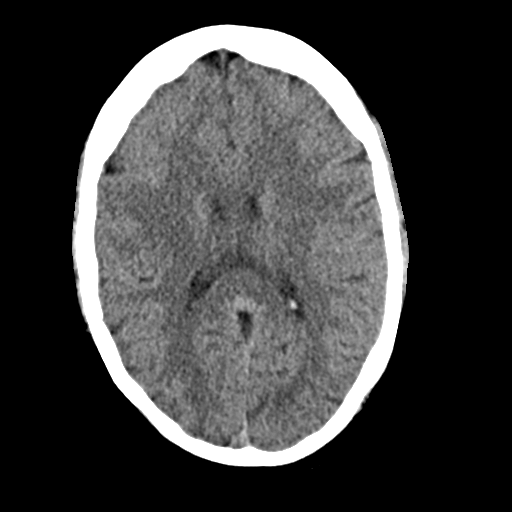
[im 16/30  bone]
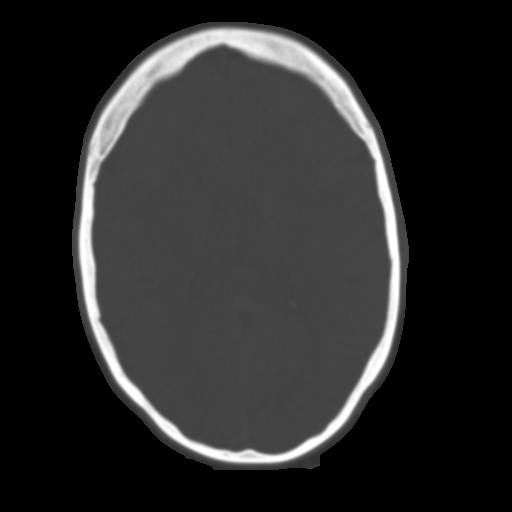
[im 19/30  brain]
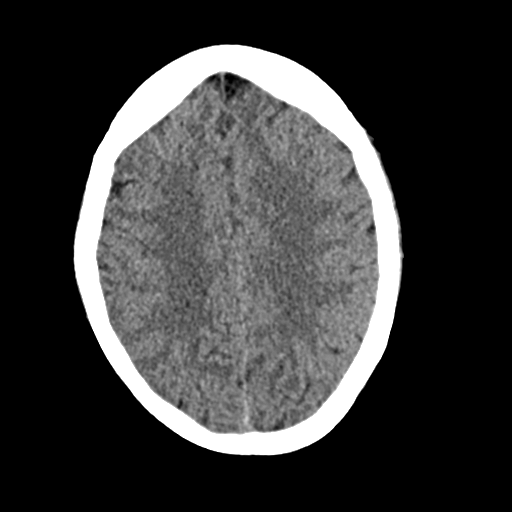
[im 22/30  brain]
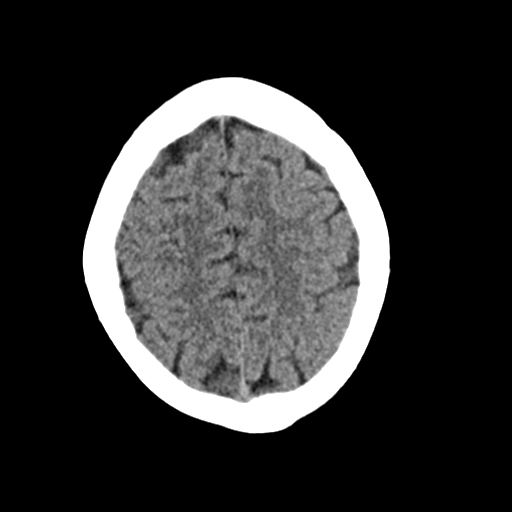
[im 25/30  brain]
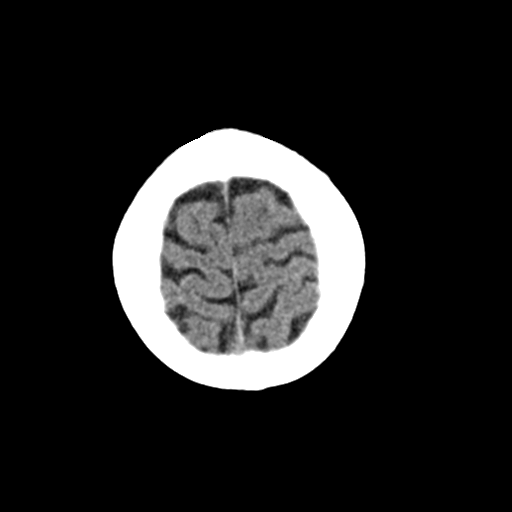
[im 28/30  brain]
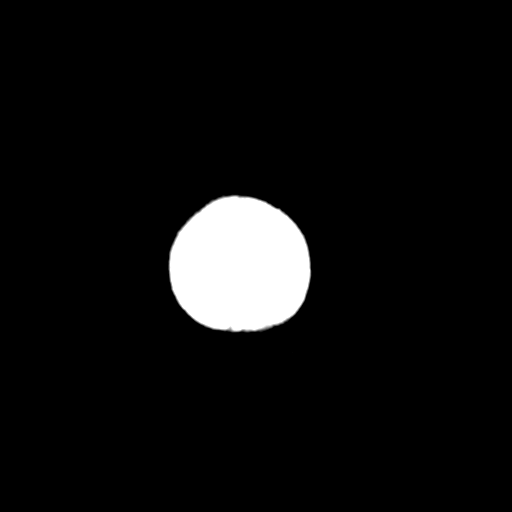
[im 28/30  bone]
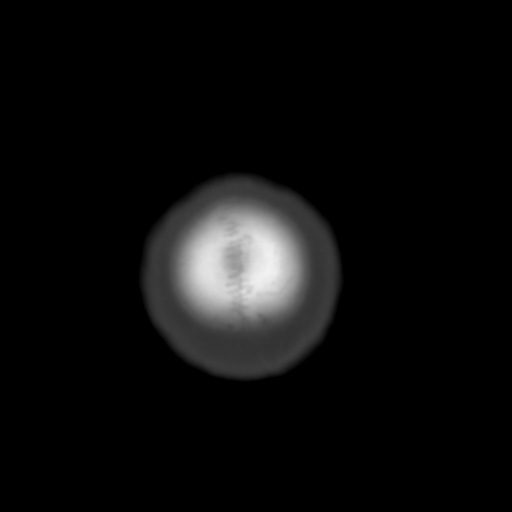

[Series 4: coronal soft · coronal · 0.31mm/px · 3 of 60 slices shown]
[im 20/60  brain]
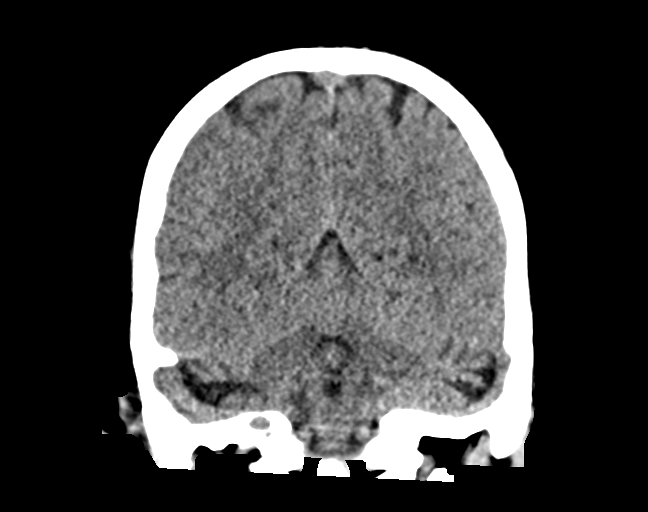
[im 27/60  brain]
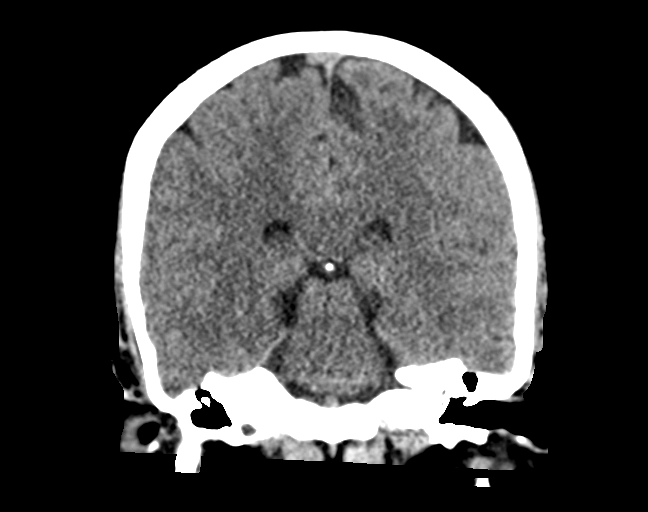
[im 33/60  brain]
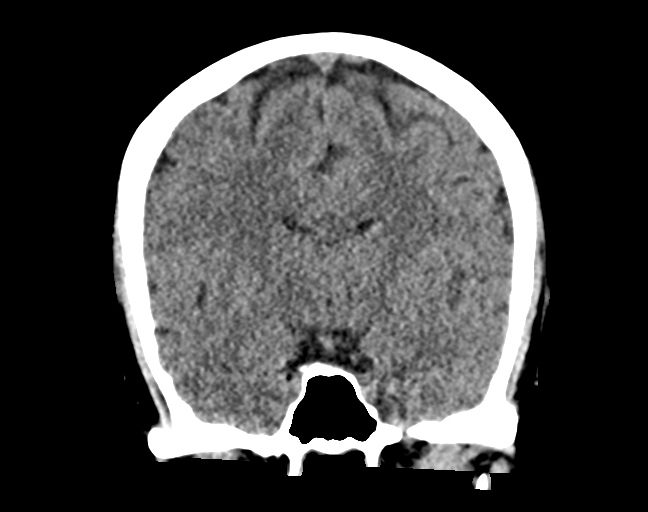

[Series 5: sag soft · sagittal · 0.32mm/px · 3 of 53 slices shown]
[im 18/53  brain]
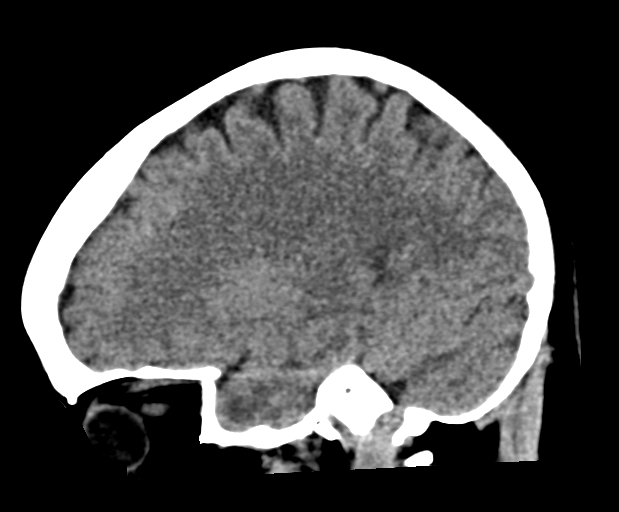
[im 27/53  brain]
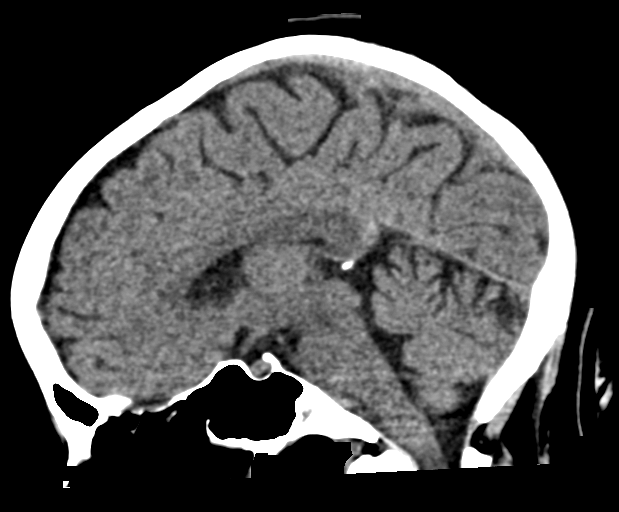
[im 35/53  brain]
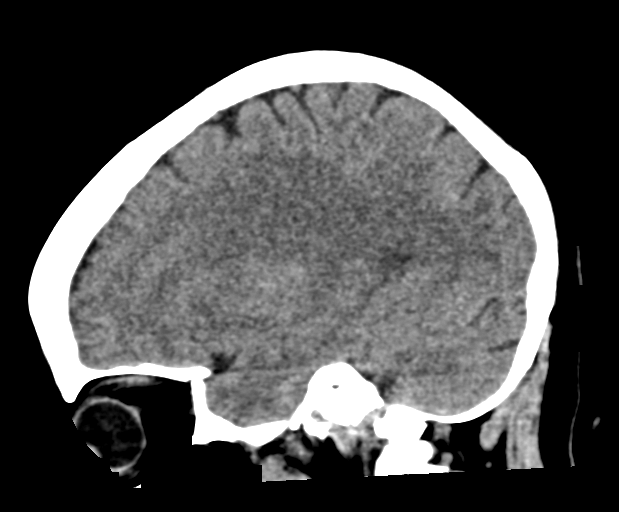

[15 of 47 positions shown; findings below may reference images not displayed]

FINDINGS: Brain: No evidence of acute infarction, hemorrhage, hydrocephalus,
extra-axial collection, or mass lesion/mass effect.

Vascular:  No hyperdense vessel or other acute findings.

Skull: No evidence of fracture or other significant bone
abnormality.

Sinuses/Orbits:  No acute findings.

Other: None.
IMPRESSION: Negative noncontrast head CT.

## 2017-04-09 MED ORDER — ONDANSETRON 8 MG PO TBDP: ORAL_TABLET | ORAL | 0 refills | Status: AC

## 2017-04-09 MED ORDER — KETOROLAC TROMETHAMINE 30 MG/ML IJ SOLN
30.0000 mg | Freq: Once | INTRAMUSCULAR | Status: AC
  Administered 2017-04-09: 30 mg via INTRAVENOUS
  Filled 2017-04-09: qty 1

## 2017-04-09 MED ORDER — ONDANSETRON HCL 4 MG/2ML IJ SOLN
4.0000 mg | Freq: Once | INTRAMUSCULAR | Status: AC
  Administered 2017-04-09: 4 mg via INTRAVENOUS
  Filled 2017-04-09: qty 2

## 2017-04-09 NOTE — ED Triage Notes (Signed)
Pt symptoms started last night, began having h/a and n/v.  Pt states she does not have htn but last night bp was 148/89.  Pt has had emesis x7 since yesterday.

## 2017-04-09 NOTE — Discharge Instructions (Signed)
Zofran as prescribed as needed for nausea.  Ibuprofen 600 mg every 6 hours as needed for pain.  Return to the emergency department if you develop a significant worsening of your symptoms, or you develop other new and concerning symptoms.

## 2017-04-09 NOTE — ED Provider Notes (Signed)
[facility] Note   CSN: [id number] Arrival date & time: 04/09/17  [id number]     History   Chief Complaint Chief Complaint  Patient presents with  . Hypertension  . Headache  . Emesis    HPI [name] is a 56 y.o. female.  Patient is a 56 year old female with past medical history of hypertension and diabetes. She presents today for evaluation of headache, nausea, and vomiting since yesterday evening. She reports having vomited multiple times. All has been nonbloody and nonbilious. She denies any fevers or chills. She does report some abdominal cramping. She denies having consumed any new or suspicious foods. She denies any history of migraine headaches. She tried Tylenol with minimal relief.  This patient speaks no [place], only Ordu. History taken through the son who is present at bedside acting as Nurse, learning disabilitytranslator.      Past Medical History:  Diagnosis Date  . Anemia   . Anxiety   . Chronic liver disease   . Depression   . Diabetes mellitus   . GERD (gastroesophageal reflux disease)   . Hepatitis C   . Hepatitis C    [place]rs ago in [place]    Patient Active Problem List   Diagnosis Date Noted  . DUB (dysfunctional uterine bleeding) 10/08/2012  . Ovarian cyst, left 11/11/2011    Past Surgical History:  Procedure Laterality Date  . none      OB History    Gravida Para Term Preterm AB Living   8 4 4   4 4    SAB TAB Ectopic Multiple Live Births     4             Home Medications    Prior to Admission medications   Medication Sig Start Date End Date Taking? Authorizing Provider  Calcium Carbonate-Vitamin D (CALCIUM-VITAMIN D) 500-200 MG-UNIT per tablet Take 1 tablet by mouth 2 (two) times daily with a meal. 05/08/13   Constant, Peggy, MD  esomeprazole (NEXIUM) 40 MG capsule Take 1 capsule (40 mg total) by mouth daily before breakfast. As needed 05/08/13   Constant, Peggy, MD  ibuprofen (ADVIL,MOTRIN) 600 MG tablet Take 1 tablet (600 mg  total) by mouth every 6 (six) hours as needed for pain. 02/25/13   Napoleon FormFerry, Pamela, MD  IRON CR PO Take by mouth 2 (two) times daily.      [provider]  Multiple Vitamins-Minerals (MULTIVITAMIN WITH MINERALS) tablet Take 1 tablet by mouth daily.    [provider]  ribavirin (COPEGUS) 200 MG tablet Take 400 mg by mouth 2 (two) times daily.    [provider]  traZODone (DESYREL) 50 MG tablet Take 2 tablets (100 mg total) by mouth at bedtime. Start with 50 mg (1 tablet) at night. If not working in a week then double to 100 mg at night. 02/09/12 03/10/12  Brooke DareZacks, Steven, MD    Family History Family History  Problem Relation Age of Onset  . Liver disease Mother   . Diabetes Mother   . Cancer Mother        liver  . Liver disease Father   . Breast cancer Sister   . Liver disease Unknown   . Hypertension Brother   . Colon cancer Neg Hx     Social History Social History  Substance Use Topics  . Smoking status: Never Smoker  . Smokeless tobacco: Never Used  . Alcohol use No     Allergies   Patient has no  known allergies.   Review of Systems Review of Systems  All other systems reviewed and are negative.    Physical Exam Updated Vital Signs BP (!) 148/85 (BP Location: Right Arm)   Pulse 86   Temp 98.1 F (36.7 C) (Oral)   Resp (!) 22   Ht 5\' 3"  (1.6 m)   Wt 72.6 kg (160 lb)   SpO2 98%   BMI 28.34 kg/m   Physical Exam  Constitutional: She is oriented to person, place, and time. She appears well-developed and well-nourished. No distress.  HENT:  Head: Normocephalic and atraumatic.  Mouth/Throat: Oropharynx is clear and moist.  Eyes: Pupils are equal, round, and reactive to light. EOM are normal.  Neck: Normal range of motion. Neck supple.  Cardiovascular: Normal rate and regular rhythm.  Exam reveals no gallop and no friction rub.   No murmur heard. Pulmonary/Chest: Effort normal and breath sounds normal. No respiratory distress. She has no  wheezes.  Abdominal: Soft. Bowel sounds are normal. She exhibits no distension. There is no tenderness.  Musculoskeletal: Normal range of motion.  Neurological: She is alert and oriented to person, place, and time. No cranial nerve deficit. She exhibits normal muscle tone. Coordination normal.  Skin: Skin is warm and dry. She is not diaphoretic.  Nursing note and vitals reviewed.    ED Treatments / Results  Labs (all labs ordered are listed, but only abnormal results are displayed) Labs Reviewed  COMPREHENSIVE METABOLIC PANEL  LIPASE, BLOOD  CBC WITH DIFFERENTIAL/PLATELET    EKG  EKG Interpretation None       Radiology No results found.  Procedures Procedures (including critical care time)  Medications Ordered in ED Medications  ketorolac (TORADOL) 30 MG/ML injection 30 mg (not administered)  ondansetron (ZOFRAN) injection 4 mg (not administered)     Initial Impression / Assessment and Plan / ED Course  I have reviewed the triage vital signs and the nursing notes.  Pertinent labs & imaging results that were available during my care of the patient were reviewed by me and considered in my medical decision making (see chart for details).  Patient presents here with headache, elevated blood pressure, nausea, and vomiting. This has worsened since last night. Her neurologic exam is nonfocal, head CT is negative, abdominal exam is benign, and laboratory studies are reassuring.  I suspect some sort of a viral etiology. She is feeling better after fluids and medications in the ED. She will be discharged with Zofran, ibuprofen, and follow-up as needed if not improving.  Final Clinical Impressions(s) / ED Diagnoses   Final diagnoses:  None    New Prescriptions New Prescriptions   No medications on file     Geoffery Lyonselo, Yaniyah Koors, MD 04/09/17 [phone]

## 2017-04-09 NOTE — ED Notes (Addendum)
Pt made aware to return if symptoms worsen or if any life threatening symptoms occur.  Discharge teaching with son and daughter in law.

## 2017-07-14 ENCOUNTER — Encounter (HOSPITAL_COMMUNITY): Payer: Self-pay

## 2022-01-10 ENCOUNTER — Encounter: Payer: Self-pay | Admitting: Internal Medicine
